# Patient Record
Sex: Female | Born: 1975 | Race: Black or African American | Hispanic: No | Marital: Married | Smoking: Never smoker
Health system: Southern US, Community
[De-identification: ages and names within clinical notes are randomized; demographics above are authoritative.]

## PROBLEM LIST (undated history)

## (undated) DIAGNOSIS — R011 Cardiac murmur, unspecified: Secondary | ICD-10-CM

## (undated) DIAGNOSIS — G43909 Migraine, unspecified, not intractable, without status migrainosus: Secondary | ICD-10-CM

## (undated) DIAGNOSIS — G510 Bell's palsy: Secondary | ICD-10-CM

## (undated) DIAGNOSIS — E559 Vitamin D deficiency, unspecified: Secondary | ICD-10-CM

## (undated) HISTORY — DX: Migraine, unspecified, not intractable, without status migrainosus: G43.909

## (undated) HISTORY — DX: Bell's palsy: G51.0

## (undated) HISTORY — PX: WISDOM TOOTH EXTRACTION: SHX21

## (undated) HISTORY — PX: TUBAL LIGATION: SHX77

## (undated) HISTORY — PX: OTHER SURGICAL HISTORY: SHX169

## (undated) HISTORY — DX: Vitamin D deficiency, unspecified: E55.9

---

## 2004-09-06 ENCOUNTER — Other Ambulatory Visit: Admission: RE | Admit: 2004-09-06 | Discharge: 2004-09-06 | Payer: Self-pay | Admitting: Obstetrics and Gynecology

## 2005-08-31 ENCOUNTER — Inpatient Hospital Stay (HOSPITAL_COMMUNITY): Admission: AD | Admit: 2005-08-31 | Discharge: 2005-08-31 | Payer: Self-pay | Admitting: Obstetrics and Gynecology

## 2005-09-11 ENCOUNTER — Other Ambulatory Visit: Admission: RE | Admit: 2005-09-11 | Discharge: 2005-09-11 | Payer: Self-pay | Admitting: Obstetrics and Gynecology

## 2006-01-21 ENCOUNTER — Encounter: Admission: RE | Admit: 2006-01-21 | Discharge: 2006-01-21 | Payer: Self-pay | Admitting: Obstetrics and Gynecology

## 2006-04-15 ENCOUNTER — Inpatient Hospital Stay (HOSPITAL_COMMUNITY): Admission: AD | Admit: 2006-04-15 | Discharge: 2006-04-20 | Payer: Self-pay | Admitting: Obstetrics and Gynecology

## 2006-04-17 ENCOUNTER — Encounter (INDEPENDENT_AMBULATORY_CARE_PROVIDER_SITE_OTHER): Payer: Self-pay | Admitting: Specialist

## 2007-09-12 ENCOUNTER — Emergency Department (HOSPITAL_COMMUNITY): Admission: EM | Admit: 2007-09-12 | Discharge: 2007-09-12 | Payer: Self-pay | Admitting: Emergency Medicine

## 2010-07-17 ENCOUNTER — Inpatient Hospital Stay (HOSPITAL_COMMUNITY): Admission: RE | Admit: 2010-07-17 | Discharge: 2010-07-20 | Payer: Self-pay | Admitting: Obstetrics and Gynecology

## 2010-12-06 LAB — CBC
HCT: 27.1 % — ABNORMAL LOW (ref 36.0–46.0)
HCT: 32 % — ABNORMAL LOW (ref 36.0–46.0)
Hemoglobin: 11.1 g/dL — ABNORMAL LOW (ref 12.0–15.0)
Hemoglobin: 9.3 g/dL — ABNORMAL LOW (ref 12.0–15.0)
MCH: 33 pg (ref 26.0–34.0)
MCH: 33.2 pg (ref 26.0–34.0)
MCHC: 34.4 g/dL (ref 30.0–36.0)
MCHC: 34.8 g/dL (ref 30.0–36.0)
MCV: 94.8 fL (ref 78.0–100.0)
MCV: 96.5 fL (ref 78.0–100.0)
Platelets: 151 10*3/uL (ref 150–400)
Platelets: 169 10*3/uL (ref 150–400)
RBC: 2.81 MIL/uL — ABNORMAL LOW (ref 3.87–5.11)
RBC: 3.38 MIL/uL — ABNORMAL LOW (ref 3.87–5.11)
RDW: 13.5 % (ref 11.5–15.5)
RDW: 13.5 % (ref 11.5–15.5)
WBC: 10.6 10*3/uL — ABNORMAL HIGH (ref 4.0–10.5)
WBC: 8.3 10*3/uL (ref 4.0–10.5)

## 2010-12-06 LAB — RPR: RPR Ser Ql: NONREACTIVE

## 2010-12-06 LAB — SURGICAL PCR SCREEN
MRSA, PCR: NEGATIVE
Staphylococcus aureus: NEGATIVE
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
## 2011-02-09 NOTE — Discharge Summary (Signed)
[name], PUCCIARELLI              ACCOUNT NO.:  [id number]   MEDICAL RECORD NO.:  [id number]          PATIENT TYPE:  INP   LOCATION:  9304                          [facility]ACILITY:  [facility]   PHYSICIAN:  Janine Limbo, M.D.DATE OF BIRTH:  05/29/1976   DATE OF ADMISSION:  04/15/2006  DATE OF DISCHARGE:  04/20/2006                                 DISCHARGE SUMMARY   ADMISSION DIAGNOSES:  1.  Intrauterine pregnancy at term.  2.  Gestational diabetes mellitus.  3.  Unfavorable cervix.   DISCHARGE DIAGNOSES:  1.  Intrauterine pregnancy at term.  2.  Gestational diabetes mellitus.  3.  Unfavorable cervix.  4.  Failed induction.  5.  Chorioamnionitis.  6.  Failure to progress.   PROCEDURE:  Primary low transverse cesarean section.   HOSPITAL COURSE:  Ms. Lopata is a 35 year old married black female, gravida  2, para 0-0-1-0, who was admitted at 39-3/7 weeks for induction of labor.  Her pregnancy had been followed by the [facility] OB/GYN M.D. Service,  and had been remarkable for history of microscopic hematuria with no  urologist evaluation, increased body mass index, first trimester bleeding,  history of migraines, history of heart murmur resolved, history of melanoma  on the right hand, gestational diabetes diet controlled.  Upon admission,  the patient received two doses of Cytotec per vagina after a reactive fetal  heart rate tracing was established.  This was on the evening of April 15, 2006, and the orders were per Dr. Estanislado Pandy.  By late morning on April 16, 2006,  the patient was started on Pitocin per low dose protocol.  By the afternoon,  her cervix was 1 cm, 25% effaced, and high, and bedside ultrasound confirmed  vertex presentation.  Fetal heart rate remained reassuring.  She was  contracting every two minutes.  She received Stadol and Phenergan for pain  medication.  The patient continued to receive Pitocin during the day on April 16, 2006.  By that evening, she had  progressed to 2 cm, 80% effaced, -2 to -  3 station, and her membranes were ruptured for clear fluid.  Blood sugars  remained stable.  Fetal heart rate remained stable.  By the morning of April 17, 2006, the patient was continuing to receive Pitocin.  Her cervix was 3  cm, 80 to 90%, and -2 station.  An IUPC had been placed during the evening  before when she was 2 cm.  By late morning on April 17, 2006, the patient's  cervix remained unchanged.  MDUs were within adequate range.  Light meconium  fluid was noted.  By the afternoon, she was 4 to 5 cm, 90% effaced.  She was  started on Unasyn due to a temperature elevation of 101.5 maximum.  She was  also given Tylenol.  By four in the afternoon, the patient had no cervical  change for two hours.  Cesarean section was recommended by Dr. Normand Sloop.  After discussing the decision with the family members, the patient elected  to proceed.  Risks and benefits were reviewed with the patient, and she  was  taken to the operating room.  Infant was a viable female with Apgar's of 2  and 8.  Weight was 6 pounds.  Core pH was 7.26.  There was also nuchal cord  x1, and pearly amniotic fluid was noted.  Infant was taken to the NICU for  care.  The patient tolerated the cesarean section well, and was taken to the  recovery room in good condition.  By postoperative day #1, she had some  elevated blood pressures at 137/92, 142/87, her other vital signs were  stable.  Her fasting blood sugar was 118, but that is after having a  popsicle.  She was positive in her fluid balance by 435 cm.  Her hemoglobin  was 9.6, and had been 11.2 preoperatively.  PIH labs were checked that  morning due to elevated blood pressures and low platelets of 146.  They had  been 193 preoperatively.  PIH labs were within normal limits.  Urine showed  no protein.  The patient was given IV Lasix x1 due to fluid retention from  the two day induction.  Her weight was 222 on admission, and 231  on  postoperative day #1.  By postoperative day #2, the patient was doing well,  her blood pressure was more stable at 130/80, her weight was 227, and  fasting blood sugar was 105.  Her hemoglobin was 9.1, and platelets were  161.  By postoperative day #3, she was doing well and was ready to go home.  Infant remained in the NICU and the patient was pumping breast milk.  She  preferred to decide contraception at her six week post-partum visit.  Blood  pressure was 118/69, her other vital signs were stable, she was afebrile.  Fasting blood sugar was 117.  Weight was 222 pounds.  JP drain was draining  a small amount of serous fluid and the drain was discontinued without  incident.  The patient was deemed to have received the full benefit of her  hospital stay and she was discharged home.   DISCHARGE MEDICATIONS:  1.  Motrin 600 mg one p.o. q.6h. p.r.n. pain.  2.  Tylox one to two p.o. q.3-4h. p.r.n. pain.  3.  Prenatal vitamin one p.o. daily.   DISCHARGE INSTRUCTIONS:  Per [facility] handout.   FOLLOWUP:  [facility] in six weeks or as needed.      Cam Hai, C.N.M.      Janine Limbo, M.D.  Electronically Signed    KS/MEDQ  D:  04/20/2006  T:  04/20/2006  Job:  [id number]

## 2011-02-09 NOTE — Op Note (Signed)
[name], [name]              ACCOUNT NO.:  [id number]   MEDICAL RECORD NO.:  [id number]          PATIENT TYPE:  INP   LOCATION:  9169                          [facility]ACILITY:  [facility]   PHYSICIAN:  Naima A. Dillard, M.D. DATE OF BIRTH:  09-15-1976   DATE OF PROCEDURE:  04/17/2006  DATE OF DISCHARGE:                                 OPERATIVE REPORT   PREOPERATIVE DIAGNOSES:  1. Intrauterine pregnancy at term.  2. Failure to progress.  3. Chorioamnionitis.  4. Gestational diabetes, diet controlled.  5. Failed induction.   POSTOPERATIVE DIAGNOSES:  1. Intrauterine pregnancy at term.  2. Failure to progress.  3. Chorioamnionitis.  4. Gestational diabetes, diet controlled.  5. Failed induction.   PROCEDURE:  Primary low transverse cesarean section.   SURGEON:  Naima A. Normand Sloop, M.D.   ASSISTANT:  Renaldo Reel. Emilee Hero, C.N.M.   ANESTHESIA:  Epidural.   INTRAVENOUS FLUIDS:  Crystalloid 1200 cc.   URINE OUTPUT:  100 cc.   ESTIMATED BLOOD LOSS:  700 cc.   COMPLICATIONS:  None.   FINDINGS:  A female infant in vertex presentation with a nuchal cord times  one.  There was purulent amniotic fluid.  Apgars were 2 and 8, with a cord  pH of 7.26.  Normal appearing abdominal and pelvic anatomy.   PROCEDURE IN DETAIL:  The patient was taken to the operating room where her  epidural anesthesia was found to be adequate.  She was placed in dorsal  supine position with a left lateral tilt, and once her anesthesia was found  to be adequate, a Pfannenstiel skin incision was made with a scalpel and  then carried down to the fascia using Bovie cautery.  The fascia was incised  in the midline and extended bilaterally using Mayo scissors.  Kochers times  two were placed and the superior aspect of the fascia was dissected off the  rectus muscles, both sharply and bluntly.  The inferior aspect of the rectus  muscles were separated from the fascia in a similar fashion.  Rectus muscles  were separated  in the midline, and peritoneum identified, tented up and  entered sharply, and extended inferiorly and superiorly with good  visualization of bowel and bladder.  The bladder blade was inserted.  Vesicouterine peritoneum was identified and entered sharply, and extended  bilaterally.  Bladder blade was inserted.  Primary lower transverse uterine  incision was made with the scalpel and then extended bilaterally bluntly.  There was noted to be purulent material.  Infant's head was delivered.  Mouth and nares were bulb suctioned.  Cord times one was easily reduced.  The body was delivered.  Cord was clamped.  The NICU team was not present  even though, according to the nurses, they were called.  At this time the  nurse anesthetist and the nurse attended the baby while I attended the  patient.  The NICU team did finally arrive and the baby was resuscitated  without difficulty.  Cord gases were obtained.  Cord blood was obtained.  The placenta was delivered manually.  The uterus was cleared of all clot and  debris.  Uterine incision was repaired with 0 Vicryl in running locked  fashion.  A second layer was used for imbrication of the uterus.  There was  some bleeding on the left side of the uterus, which was made hemostatic with  two figure-of-eight 0 Vicryl stitches.  Irrigation was done.  Hemostasis was  assured.  The patient had normal appearing tubes, ovaries and abdominal  anatomy.  Peritoneum was closed with 0 chromic, then muscle was inspected  and noted to be hemostatic.  The fascia was closed with 0 Vicryl in a  running fashion.  Jackson-Pratt drain was placed in the subcutaneous tissue,  and the subcutaneous tissue was reapproximated using 2-0 plain.  The skin  was reapproximated using 3-0 Monocryl subcuticular fashion.  Sponge, lap and  needle counts were correct.  The patient went to the recovery room in stable  condition.      Naima A. Normand Sloop, M.D.  Electronically Signed      NAD/MEDQ  D:  04/17/2006  T:  04/17/2006  Job:  [id number]

## 2013-04-21 ENCOUNTER — Emergency Department (HOSPITAL_COMMUNITY)
Admission: EM | Admit: 2013-04-21 | Discharge: 2013-04-21 | Disposition: A | Discharge status: Home / Self Care | Payer: BC Managed Care – PPO | Source: Home / Self Care | Attending: Family Medicine | Admitting: Family Medicine

## 2013-04-21 ENCOUNTER — Encounter (HOSPITAL_COMMUNITY): Payer: Self-pay | Admitting: Emergency Medicine

## 2013-04-21 DIAGNOSIS — N390 Urinary tract infection, site not specified: Secondary | ICD-10-CM

## 2013-04-21 DIAGNOSIS — K5289 Other specified noninfective gastroenteritis and colitis: Secondary | ICD-10-CM

## 2013-04-21 DIAGNOSIS — K529 Noninfective gastroenteritis and colitis, unspecified: Secondary | ICD-10-CM

## 2013-04-21 LAB — POCT URINALYSIS DIP (DEVICE)
Bilirubin Urine: NEGATIVE
Glucose, UA: NEGATIVE mg/dL
Ketones, ur: NEGATIVE mg/dL
Nitrite: NEGATIVE
Protein, ur: 100 mg/dL — AB
Specific Gravity, Urine: 1.02 (ref 1.005–1.030)
pH: 7 (ref 5.0–8.0)

## 2013-04-21 LAB — POCT PREGNANCY, URINE: Preg Test, Ur: NEGATIVE

## 2013-04-21 MED ORDER — CEPHALEXIN 500 MG PO CAPS: 500.0000 mg | ORAL_CAPSULE | Freq: Four times a day (QID) | ORAL | Status: DC

## 2013-04-21 NOTE — ED Notes (Signed)
Pt reports lower back pain, abdominal cramping, n/v/d. Symptoms started this a.m Pt has been increasing water intake and drinking cranberry juice.  Alert and oriented no signs of distress.

## 2013-04-21 NOTE — ED Provider Notes (Signed)
  CSN: [id number]     Arrival date & time 04/21/13  1935 History     First MD Initiated Contact with Patient 04/21/13 1945     Chief Complaint  Patient presents with  . Back Pain    symptoms started this a.m  . Abdominal Pain   (Consider location/radiation/quality/duration/timing/severity/associated sxs/prior Treatment) Patient is a 37 y.o. female presenting with back pain and abdominal pain. The history is provided by the patient.  Back Pain Location:  Gluteal region Quality:  Aching Radiates to:  Does not radiate Pain severity:  Mild Chronicity:  New Associated symptoms: abdominal pain, dysuria and pelvic pain   Abdominal Pain Associated symptoms include abdominal pain.    History reviewed. No pertinent past medical history. History reviewed. No pertinent past surgical history. History reviewed. No pertinent family history. History  Substance Use Topics  . Smoking status: Never Smoker   . Smokeless tobacco: Not on file  . Alcohol Use: Yes   OB History   Grav Para Term Preterm Abortions TAB SAB Ect Mult Living                 Review of Systems  Constitutional: Negative.   HENT: Negative.   Gastrointestinal: Positive for nausea, vomiting, abdominal pain and diarrhea.  Genitourinary: Positive for dysuria, urgency, frequency and pelvic pain.  Musculoskeletal: Positive for back pain.  Skin: Negative.     Allergies  Review of patient's allergies indicates no known allergies.  Home Medications  No current outpatient prescriptions on file. BP 143/90  Pulse 64  Temp(Src) 98.3 F (36.8 C) (Oral)  Resp 18  SpO2 98%  LMP 04/07/2013 Physical Exam  Nursing note and vitals reviewed. Constitutional: She is oriented to person, place, and time. She appears well-developed and well-nourished. No distress.  Abdominal: Soft. Bowel sounds are normal. She exhibits no distension and no mass. There is no hepatosplenomegaly. There is tenderness in the suprapubic area. There is no  rebound, no guarding and no CVA tenderness.  Neurological: She is alert and oriented to person, place, and time.  Skin: Skin is warm and dry.    ED Course   Procedures (including critical care time)  Labs Reviewed  POCT URINALYSIS DIP (DEVICE) - Abnormal; Notable for the following:    Hgb urine dipstick LARGE (*)    Protein, ur 100 (*)    Leukocytes, UA SMALL (*)    All other components within normal limits  POCT PREGNANCY, URINE   No results found. 1. UTI (lower urinary tract infection)   2. Gastroenteritis, acute     MDM    Linna Hoff, MD 04/21/13 2014

## 2018-05-20 ENCOUNTER — Other Ambulatory Visit: Payer: Self-pay

## 2018-05-20 ENCOUNTER — Encounter (HOSPITAL_COMMUNITY): Payer: Self-pay | Admitting: *Deleted

## 2018-05-20 ENCOUNTER — Emergency Department (HOSPITAL_COMMUNITY)
Admission: EM | Admit: 2018-05-20 | Discharge: 2018-05-20 | Disposition: A | Discharge status: Home / Self Care | Payer: BC Managed Care – PPO | Attending: Emergency Medicine | Admitting: Emergency Medicine

## 2018-05-20 DIAGNOSIS — G510 Bell's palsy: Secondary | ICD-10-CM | POA: Diagnosis not present

## 2018-05-20 DIAGNOSIS — R29810 Facial weakness: Secondary | ICD-10-CM | POA: Diagnosis present

## 2018-05-20 MED ORDER — PREDNISONE 20 MG PO TABS: 60.0000 mg | ORAL_TABLET | Freq: Every day | ORAL | 0 refills | Status: DC

## 2018-05-20 MED ORDER — PREDNISONE 20 MG PO TABS
60.0000 mg | ORAL_TABLET | Freq: Once | ORAL | Status: AC
  Administered 2018-05-20: 60 mg via ORAL
  Filled 2018-05-20: qty 3

## 2018-05-20 NOTE — ED Notes (Signed)
Pt stable, ambulatory, and verbalizes understanding of d/c instructions.  

## 2018-05-20 NOTE — ED Triage Notes (Signed)
States her tongue started getting sore on Fri. States the left side of her mouth was getting numb. States last night her right eye wouldn't close as well as the left. C/o drawing to the right side of the mouth. No weakness in ext.

## 2018-05-20 NOTE — ED Provider Notes (Signed)
[facility] EMERGENCY DEPARTMENT Provider Note   CSN: [id number] Arrival date & time: 05/20/18  3785     History   Chief Complaint Chief Complaint  Patient presents with  . Facial Droop    HPI [name] is a 42 y.o. female.  Patient with no significant past medical history presents with acute onset and slowly worsening left-sided facial droop and inability to close her left eye starting yesterday.  Patient noted some numbness and tingling in her left face and left tongue yesterday with a change in taste.  This morning when she woke up and was brushing her teeth she noted some toothpaste dribbling from the left corner of her mouth.  She denies any vision changes, headache.  No head injury.  No fevers or confusion.  No weakness, numbness, or tingling in the arms of the legs.  No difficulty with coordination or with walking.  Patient's father, who reportedly had a poor lifestyle and used drugs and alcohol, had "an aneurysm" at about age 59 and passed away.  Patient states that she has had 2 family members on that side who have had Bell's palsy.  No treatments prior to arrival.     History reviewed. No pertinent past medical history.  There are no active problems to display for this patient.   Past Surgical History:  Procedure Laterality Date  . CESAREAN SECTION       OB History   None      Home Medications    Prior to Admission medications   Medication Sig Start Date End Date Taking? Authorizing Provider  cephALEXin (KEFLEX) 500 MG capsule Take 1 capsule (500 mg total) by mouth 4 (four) times daily. Take all of medicine and drink lots of fluids 04/21/13   Billy Fischer, MD    Family History No family history on file.  Social History Social History   Tobacco Use  . Smoking status: Never Smoker  . Smokeless tobacco: Never Used  Substance Use Topics  . Alcohol use: Yes  . Drug use: No     Allergies   Patient has no known  allergies.   Review of Systems Review of Systems  Constitutional: Negative for fever.  HENT: Negative for congestion, dental problem, rhinorrhea and sinus pressure.        + dysgeusia  Eyes: Negative for photophobia, discharge, redness and visual disturbance.  Respiratory: Negative for shortness of breath.   Cardiovascular: Negative for chest pain.  Gastrointestinal: Negative for nausea and vomiting.  Musculoskeletal: Negative for gait problem, neck pain and neck stiffness.  Skin: Negative for rash.  Neurological: Positive for facial asymmetry and weakness. Negative for syncope, speech difficulty, light-headedness, numbness and headaches.  Psychiatric/Behavioral: Negative for confusion.     Physical Exam Updated Vital Signs BP (!) 144/105 (BP Location: Right Arm)   Pulse 66   Temp 97.7 F (36.5 C) (Oral)   Resp 18   Ht 5' 2.25" (1.581 m)   Wt 81.6 kg   SpO2 100%   BMI 32.66 kg/m   Physical Exam  Constitutional: She is oriented to person, place, and time. She appears well-developed and well-nourished.  HENT:  Head: Normocephalic and atraumatic.  Right Ear: Tympanic membrane, external ear and ear canal normal.  Left Ear: Tympanic membrane, external ear and ear canal normal.  Nose: Nose normal.  Mouth/Throat: Uvula is midline, oropharynx is clear and moist and mucous membranes are normal.  Eyes: Pupils are equal, round, and reactive to light.  Conjunctivae, EOM and lids are normal. Right eye exhibits no nystagmus. Left eye exhibits no nystagmus.  Neck: Normal range of motion. Neck supple.  No carotid bruits heard.   Cardiovascular: Normal rate and regular rhythm.  Pulmonary/Chest: Effort normal and breath sounds normal.  Abdominal: Soft. There is no tenderness.  Musculoskeletal:       Cervical back: She exhibits normal range of motion, no tenderness and no bony tenderness.  Neurological: She is alert and oriented to person, place, and time. She has normal strength and  normal reflexes. A cranial nerve deficit is present. No sensory deficit. She displays a negative Romberg sign. Coordination and gait normal. GCS eye subscore is 4. GCS verbal subscore is 5. GCS motor subscore is 6.  CN VII nerve palsy: L facial droop noted with smiling, L ptosis, weakness of L forehead, normal sensation on exam.   Pupils PERRL. No tongue deviation.   Skin: Skin is warm and dry.  Psychiatric: She has a normal mood and affect.  Nursing note and vitals reviewed.    ED Treatments / Results  Labs (all labs ordered are listed, but only abnormal results are displayed) Labs Reviewed - No data to display  EKG None  Radiology No results found.  Procedures Procedures (including critical care time)  Medications Ordered in ED Medications - No data to display   Initial Impression / Assessment and Plan / ED Course  I have reviewed the triage vital signs and the nursing notes.  Pertinent labs & imaging results that were available during my care of the patient were reviewed by me and considered in my medical decision making (see chart for details).     Patient seen and examined. Symptoms suggestive of Bell's palsy.  No vesicles noted in ear canal.  No headache or head trauma.  Low concern for intracranial bleeding.  Vital signs reviewed and are as follows: BP (!) 122/109   Pulse 63   Temp 97.7 F (36.5 C) (Oral)   Resp 18   Ht 5' 2.25" (1.581 m)   Wt 81.6 kg   SpO2 100%   BMI 32.66 kg/m   8:25 AM Patient discussed with and seen by Dr. Sherry Ruffing.   Agrees Bell's Palsy. Patient has moderate symptoms. No indication for antivirals per Up-to-date.   Pt updated.   Patient urged to return with worsening symptoms or other concerns. Patient verbalized understanding and agrees with plan.    Final Clinical Impressions(s) / ED Diagnoses   Final diagnoses:  Bell's palsy   CN VII nerve palsy.  Forehead involvement noted.  No other focal neurological deficits on exam.   Do not feel that imaging is indicated at this time.  Treatment as above.  Patient given first dose of prednisone in the emergency department.  Home with 6 additional days.  No evidence for antivirals.  ED Discharge Orders         Ordered    predniSONE (DELTASONE) 20 MG tablet  Daily     05/20/18 0822           Carlisle Cater, PA-C 05/20/18 0827    Tegeler, Gwenyth Allegra, MD 05/20/18 [phone]

## 2018-05-20 NOTE — ED Notes (Signed)
ED Provider at bedside. 

## 2018-05-20 NOTE — Discharge Instructions (Signed)
Please read and follow all provided instructions.  Your diagnoses today include:  1. Bell's palsy     Tests performed today include:  Vital signs. See below for your results today.   Medications prescribed:   Prednisone - steroid medicine   It is best to take this medication in the morning to prevent sleeping problems. If you are diabetic, monitor your blood sugar closely and stop taking Prednisone if blood sugar is over 300. Take with food to prevent stomach upset.   Take any prescribed medications only as directed.  Home care instructions:  Follow any educational materials contained in this packet.  Use artificial tears in left eye so it doesn't dry out once an hour while awake. Use a patch or carefully tape eye to keep it closed at night.   Follow-up instructions: Please follow-up with your primary care provider in the next 3 days for further evaluation of your symptoms.   Return instructions:   Please return to the Emergency Department if you experience worsening symptoms.   Please return if you have any other emergent concerns.  Additional Information:  Your vital signs today were: BP 129/82    Pulse (!) 59    Temp 97.7 F (36.5 C) (Oral)    Resp 18    Ht 5' 2.25" (1.581 m)    Wt 81.6 kg    SpO2 99%    BMI 32.66 kg/m  If your blood pressure (BP) was elevated above 135/85 this visit, please have this repeated by your doctor within one month. --------------

## 2018-08-05 ENCOUNTER — Encounter: Payer: Self-pay | Admitting: *Deleted

## 2018-08-06 ENCOUNTER — Encounter: Payer: Self-pay | Admitting: Neurology

## 2018-08-06 ENCOUNTER — Ambulatory Visit: Payer: BC Managed Care – PPO | Admitting: Neurology

## 2018-08-06 VITALS — BP 120/86 | HR 70 | Ht 62.25 in | Wt 199.5 lb

## 2018-08-06 DIAGNOSIS — R432 Parageusia: Secondary | ICD-10-CM | POA: Diagnosis not present

## 2018-08-06 DIAGNOSIS — G510 Bell's palsy: Secondary | ICD-10-CM

## 2018-08-06 DIAGNOSIS — R471 Dysarthria and anarthria: Secondary | ICD-10-CM

## 2018-08-06 NOTE — Progress Notes (Signed)
[facility] Clinic Note - Initial Visit   Date: 08/06/18  [name] MRN: [id number] DOB: Apr 15, 1976   Dear Dr. Josefine Class:   Thank you for your kind referral of [name] for consultation of left Bell's palsy. Although her history is well known to you, please allow [name] to reiterate it for the purpose of our medical record. The patient was accompanied to the clinic by self.   History of Present Illness: [name] is a 42 y.o. left-handed African American female with no prior medical history presenting for evaluation of left Bell's palsy.  She is a Technical sales engineer.   She was in her usual state of health until 05/19/2017.  She had a staff meeting on 05/19/2018 and remembers that the taste of water and gatorade did not taste well.  That evening, she could not taste her dinner. By nighttime, she noticed weakness of the left eye and being unable to blink.  The following morning, she developed severe photosensitivity and left facial droop and slurred speech.  She did not have weakness of the left arm/leg.  She was evaluated in the ER on 8/27, diagnosed with left Bell's palsy and given course of prednisone.  She had noticed mild improvement, such that her eye will now close at night time, but she continues to have severe facial droop and eyelid weakness.   She was previously having severe headaches, which has improved. Rest can help improve her speech.  She has not returned to work because of speech difficulty.  Her PCP check HbA1c and TSH which is normal   Past Medical History:  Diagnosis Date  . Bell's palsy   . Migraine   . Vitamin D deficiency     Past Surgical History:  Procedure Laterality Date  . CESAREAN SECTION    . TUBAL LIGATION       Medications:  Outpatient Encounter Medications as of 08/06/2018  Medication Sig  . ergocalciferol (VITAMIN D2) 1.25 MG (50000 UT) capsule Take 50,000 Units by mouth once a week.  . phentermine 37.5  MG capsule Take 37.5 mg by mouth every morning.   No facility-administered encounter medications on file as of 08/06/2018.      Allergies: No Known Allergies  Family History: Family History  Problem Relation Age of Onset  . Hypertension Father   . Aneurysm Father   . Stroke Father   . Healthy Mother   . Healthy Sister     Social History: Social History   Tobacco Use  . Smoking status: Never Smoker  . Smokeless tobacco: Never Used  Substance Use Topics  . Alcohol use: Yes    Comment: socially  . Drug use: No   Social History   Social History Narrative   Lives with husband, 3 children and granddaughter in a 2 story home.     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     Education: Masters.     Review of Systems:  CONSTITUTIONAL: No fevers, chills, night sweats, or weight loss.   EYES: No visual changes or eye pain ENT: No hearing changes.  No history of nose bleeds.   RESPIRATORY: No cough, wheezing and shortness of breath.   CARDIOVASCULAR: Negative for chest pain, and palpitations.   GI: Negative for abdominal discomfort, blood in stools or black stools.  No recent change in bowel habits.   GU:  No history of incontinence.   MUSCLOSKELETAL: No history of joint pain  or swelling.  No myalgias.   SKIN: Negative for lesions, rash, and itching.   HEMATOLOGY/ONCOLOGY: Negative for prolonged bleeding, bruising easily, and swollen nodes.  No history of cancer.   ENDOCRINE: Negative for cold or heat intolerance, polydipsia or goiter.   PSYCH:  No depression or anxiety symptoms.   NEURO: As Above.   Vital Signs:  BP 120/86   Pulse 70   Ht 5' 2.25" (1.581 m)   Wt 199 lb 8 oz (90.5 kg)   SpO2 100%   BMI 36.20 kg/m    General Medical Exam:   General:  Well appearing, comfortable.   Eyes/ENT: see cranial nerve examination.   Neck: No masses appreciated.  Full range of motion without tenderness.  No carotid bruits. Respiratory:  Clear to  auscultation, good air entry bilaterally.   Cardiac:  Regular rate and rhythm, no murmur.   Extremities:  No deformities, edema, or skin discoloration.  Skin:  No rashes or lesions.  Neurological Exam: MENTAL STATUS including orientation to time, place, person, recent and remote memory, attention span and concentration, language, and fund of knowledge is normal.  Speech is not dysarthric.  CRANIAL NERVES: II:  No visual field defects.  Unremarkable fundi.   III-IV-VI: Pupils equal round and reactive to light.  Normal conjugate, extra-ocular eye movements in all directions of gaze.  No nystagmus.  No ptosis.   V:  Normal facial sensation. Jaw jerk is absent.   VII:  Severe left facial weakness with asymmetric smile.  She had no movement of frontalis, 1/4 orbicularis oris and oculi, 0/5 buccinator and platysma.  No snout.  Myerson's reflex is present on the right only.   IX-X:  Normal palatal movement.   XI:  Normal shoulder shrug and head rotation.   XII:  Normal tongue strength and range of motion, no deviation or fasciculation.  MOTOR:  No atrophy, fasciculations or abnormal movements.  No pronator drift.  Tone is normal.    Right Upper Extremity:    Left Upper Extremity:    Deltoid  5/5   Deltoid  5/5   Biceps  5/5   Biceps  5/5   Triceps  5/5   Triceps  5/5   Wrist extensors  5/5   Wrist extensors  5/5   Wrist flexors  5/5   Wrist flexors  5/5   Finger extensors  5/5   Finger extensors  5/5   Finger flexors  5/5   Finger flexors  5/5   Dorsal interossei  5/5   Dorsal interossei  5/5   Abductor pollicis  5/5   Abductor pollicis  5/5   Tone (Ashworth scale)  0  Tone (Ashworth scale)  0   Right Lower Extremity:    Left Lower Extremity:    Hip flexors  5/5   Hip flexors  5/5   Hip extensors  5/5   Hip extensors  5/5   Knee flexors  5/5   Knee flexors  5/5   Knee extensors  5/5   Knee extensors  5/5   Dorsiflexors  5/5   Dorsiflexors  5/5   Plantarflexors  5/5   Plantarflexors  5/5    Toe extensors  5/5   Toe extensors  5/5   Toe flexors  5/5   Toe flexors  5/5   Tone (Ashworth scale)  0  Tone (Ashworth scale)  0   MSRs:  Right  Left brachioradialis 2+  brachioradialis 2+  biceps 2+  biceps 2+  triceps 2+  triceps 2+  patellar 2+  patellar 2+  ankle jerk 2+  ankle jerk 2+  Hoffman no  Hoffman no  plantar response down  plantar response down   SENSORY:  Normal and symmetric perception of light touch, pinprick, vibration, and proprioception.   COORDINATION/GAIT: Normal finger-to- nose-finger.  Intact rapid alternating movements bilaterally. Gait narrow based and stable. Tandem and stressed gait intact.    IMPRESSION: Mrs. Valade is a 42 year-old female referred for evaluation of left facial paresis which started in August 2019.  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  She has mild dysarthria with labial sounds, which is most likely due to her facial weakness.  There is no sensory changes.  She does have Myerson's sign.  Unfortunately, she continues to have severe facial weakness that hopefully will improve with time.  I encouraged her to do facial exercises and offered speech therapy, which she declined.  Although classic Bell's palsy can certainly manifest with facial paresis and changes in taste, dysarthria and photosensitivity is atypical.  Therefore, I will obtain MRI/A brain to evaluate for any structural pathology, as well as aneurysm, given that her father died of ruptured cerebral aneurysm.   I had extensive discussion regarding pathophysiology, natural course, and management options.    PLAN/RECOMMENDATIONS:  1. MRI brain wwo contrast 2. MRA head 3. Apply eye protection such as eye patch to eye when sleeping to prevent corneal abrasion  4. Apply ointment to eye prior to sleeping  5. Apply normal saline eye drops as needed throughout the  day.  Return to clinic in 3 months   Thank you for allowing me to participate in patient's care.  If I can answer any additional questions, I would be pleased to do so.    Sincerely,    Iyad Deroo K. Posey Pronto, DO

## 2018-08-06 NOTE — Patient Instructions (Addendum)
MRI brain wwo contrast  MRA head  Apply eye protection such as eye patch to eye when sleeping to prevent corneal abrasion   Apply ointment to eye prior to sleeping   Apply normal saline eye drops as needed throughout the day  Please call my office if you would like to start speech therapy.    Return to clinic in 3 months

## 2018-08-29 ENCOUNTER — Other Ambulatory Visit: Payer: BC Managed Care – PPO

## 2018-09-22 ENCOUNTER — Other Ambulatory Visit: Payer: BC Managed Care – PPO

## 2018-09-22 ENCOUNTER — Inpatient Hospital Stay: Admission: RE | Admit: 2018-09-22 | Payer: BC Managed Care – PPO | Source: Ambulatory Visit

## 2018-09-23 ENCOUNTER — Ambulatory Visit
Admission: RE | Admit: 2018-09-23 | Discharge: 2018-09-23 | Disposition: A | Discharge status: Home / Self Care | Payer: BC Managed Care – PPO | Source: Ambulatory Visit | Attending: Neurology | Admitting: Neurology

## 2018-09-23 DIAGNOSIS — R471 Dysarthria and anarthria: Secondary | ICD-10-CM

## 2018-09-23 DIAGNOSIS — R432 Parageusia: Secondary | ICD-10-CM

## 2018-09-23 DIAGNOSIS — G510 Bell's palsy: Secondary | ICD-10-CM

## 2018-09-23 MED ORDER — GADOBENATE DIMEGLUMINE 529 MG/ML IV SOLN
18.0000 mL | Freq: Once | INTRAVENOUS | Status: AC | PRN
  Administered 2018-09-23: 18 mL via INTRAVENOUS

## 2018-09-29 ENCOUNTER — Telehealth: Payer: Self-pay | Admitting: Neurology

## 2018-09-29 NOTE — Telephone Encounter (Signed)
Called patient back and mail box is full.

## 2018-09-29 NOTE — Telephone Encounter (Signed)
Patient called about needing a letter for the ext on her short term disability. Please write this and fax to [phone]; ATTN: Joaquin [employer]. Also, she wants a call to know that Dr.Patel agreed to do this or not. Thanks!

## 2018-09-29 NOTE — Telephone Encounter (Signed)
Patient called back regarding information on her Short term Disability. Please Call. Thanks

## 2018-09-30 ENCOUNTER — Telehealth: Payer: Self-pay | Admitting: *Deleted

## 2018-09-30 NOTE — Telephone Encounter (Signed)
-----   Message from Alda Berthold, DO sent at 09/30/2018  2:01 PM EST ----- Please inform patient that her MRI brain shows trace nerve inflammation, which can be seen in Bell's palsy.  No other worrisome findings, such as any tumor, stroke, or multiple sclerosis.  Also, there her blood vessels are normal - no aneurysm.  We will follow-up with HR paperwork once it is forwarded to [place].  Thanks.

## 2018-09-30 NOTE — Telephone Encounter (Signed)
I will send letter with results.

## 2018-09-30 NOTE — Telephone Encounter (Signed)
I spoke with patient and informed her that we can't send a letter and requested for her to send me the proper paper work to fill out for [employer].

## 2018-10-02 ENCOUNTER — Other Ambulatory Visit: Payer: BC Managed Care – PPO

## 2018-10-09 ENCOUNTER — Telehealth: Payer: Self-pay | Admitting: Neurology

## 2018-10-09 NOTE — Telephone Encounter (Signed)
Patient calling about page 2 in paperwork that it should say "YES" and not no where it says it does mess with her ability to work. Please correct this and fax again. Long Term Dis paperwork been faxed also? Thanks!

## 2018-10-09 NOTE — Telephone Encounter (Signed)
Paper work has been faxed

## 2018-10-09 NOTE — Telephone Encounter (Signed)
Patient calling in wanting to know if this FMLA paperwork was ever faxed to her job. Please call at [phone]. Thanks!

## 2018-10-10 ENCOUNTER — Other Ambulatory Visit: Payer: BC Managed Care – PPO

## 2018-10-10 NOTE — Telephone Encounter (Signed)
Patient has been informed that we do not do long term disability paperwork.

## 2018-10-21 NOTE — Telephone Encounter (Signed)
Patient is calling in about her diability paperwork. Please call her back at [phone]. Thanks!

## 2018-10-22 NOTE — Telephone Encounter (Signed)
Patient has been informed that we will assess her at her appointment next week and discuss paperwork then.

## 2018-10-25 NOTE — Progress Notes (Signed)
Follow-up Visit   Date: 10/29/18    [name] MRN: [id number] DOB: 28-Aug-1976   Interim History: [name] is a 43 y.o. left-handed African American female with no prior medical history returning to the clinic for follow-up of left Bell's palsy.  She is a [employer].  The patient was accompanied to the clinic by husband who also provides collateral information.    History of present illness: She was in her usual state of health until 05/19/2017.  She had a staff meeting on 05/19/2018 and remembers that the taste of water and gatorade did not taste well.  That evening, she could not taste her dinner. By nighttime, she noticed weakness of the left eye and being unable to blink.  The following morning, she developed severe photosensitivity and left facial droop and slurred speech.  She did not have weakness of the left arm/leg.  She was evaluated in the ER on 8/27, diagnosed with left Bell's palsy and given course of prednisone.  She had noticed mild improvement, such that her eye will now close at night time, but she continues to have severe facial droop and eyelid weakness.   She was previously having severe headaches, which has improved. Rest can help improve her speech.  She has not returned to work because of speech difficulty.  Her PCP check HbA1c and TSH which is normal   UPDATE 10/29/2018: She is here for 68-month follow-up visit and has noticed trace movement of the left side of the face.  She is concerned about synkinesis and has noticed a dimple in the chin.  She denies any abnormal appearing, although has noticed that she is able to hear on the left side, which is improved.  Her photosensitivity has improved.  She continues to have lack of taste.  She is not currently working and is seeking continuous leave because her work involves speaking.  She reports being unable to speak as fast as she normally would, however is still able to communicate.  Her mood is doing  well.  Medications:  Current Outpatient Medications on File Prior to Visit  Medication Sig Dispense Refill  . phentermine 37.5 MG capsule Take 37.5 mg by mouth every morning.     No current facility-administered medications on file prior to visit.     Allergies: No Known Allergies  Review of Systems:  CONSTITUTIONAL: No fevers, chills, night sweats, or weight loss.  EYES: No visual changes or eye pain ENT: No hearing changes.  No history of nose bleeds.   RESPIRATORY: No cough, wheezing and shortness of breath.   CARDIOVASCULAR: Negative for chest pain, and palpitations.   GI: Negative for abdominal discomfort, blood in stools or black stools.  No recent change in bowel habits.   GU:  No history of incontinence.   MUSCLOSKELETAL: No history of joint pain or swelling.  No myalgias.   SKIN: Negative for lesions, rash, and itching.   ENDOCRINE: Negative for cold or heat intolerance, polydipsia or goiter.   PSYCH:  No  depression or anxiety symptoms.   NEURO: As Above.   Vital Signs:  BP 122/78   Pulse 68   Ht 5' 2.25" (1.581 m)   Wt 190 lb (86.2 kg)   SpO2 99%   BMI 34.47 kg/m   General Medical Exam:   General:  Well appearing, comfortable  Eyes/ENT: see cranial nerve examination.   Neck: No masses appreciated.  Full range of motion without tenderness.  No carotid  bruits. Respiratory:  Clear to auscultation, good air entry bilaterally.   Cardiac:  Regular rate and rhythm, no murmur.   Ext:  No edema  Neurological Exam: MENTAL STATUS including orientation to time, place, person, recent and remote memory, attention span and concentration, language, and fund of knowledge is normal.  Speech is not mildly dysarthric, 100% comprehensible speech.  CRANIAL NERVES:  Pupils equal round and reactive to light.  Normal conjugate, extra-ocular eye movements in all directions of gaze.  Mild left ptosis. Normal facial sensation. She continues to have moderate-severe left facial weakness,  there is 1/5 motor strength of frontalis, 3/5 orbicularis oculi, 2/5 buccinator, 3/5 nasalis, 2/5 orbicularis oris, and 2/5 platysma (improved).  Tongue is midline.  MOTOR:  Motor strength is 5/5 in all extremities.  No atrophy, fasciculations or abnormal movements.  No pronator drift.  Tone is normal.    MSRs:  Reflexes are 2+/4 throughout.  SENSORY:  Intact to vibration throughout.  COORDINATION/GAIT:   Gait narrow based and stable.   Data: MRI/A brain 09/23/2018: 1. Subtle asymmetric T2 signal and enhancement of the Left Facial Nerve mastoid segment. Questionable asymmetric enhancement also of the left geniculate ganglion. But otherwise normal appearing left internal auditory structures, normal left parotid space, and unremarkable face soft tissues. This suggest inflammation of the nerve such as due to Viral Neuritis. 2. Normal MRI appearance of the brain. No evidence of metastatic disease. 3. Normal intracranial MRA.  IMPRESSION/PLAN: Left Bell's palsy with moderate-severe facial weakness (House-brackmann scale IV) on the left (August 2019).  Exam shows slight improvement from her last visit such that she does have trace movement along the facial nerve myotome.  She is able to close her left eyelid better and there is no sign of corneal irritation.  She has very mild dysarthria with labial sounds.  I have encouraged to continue doing facial exercises at home.  Speech therapy was declined.  She may have some synkinesis involving the chin, which indicates that reinnervation is occurring.    She is requesting permanent disability.  She does not have visual impairment as result of Bell's palsy.  She reports being unable to speak, and therefore and not able to perform her work.  I am in disagreement of this, as she is able to verbally communicate.  She has mild dysarthria, and in my medical opinion and based on her evaluation, she does not need continuous medical leave.  Patient was not pleased  with this, and I offered that she can seek a second opinion or talk to her primary care doctor for their opinion.    Return to clinic in 6 months  Greater than 50% of this 30 minute visit was spent in counseling, explanation of diagnosis, and answering questions/conerns.    Thank you for allowing me to participate in patient's care.  If I can answer any additional questions, I would be pleased to do so.    Sincerely,    Donika K. Posey Pronto, DO

## 2018-10-28 ENCOUNTER — Telehealth: Payer: Self-pay | Admitting: Neurology

## 2018-10-28 NOTE — Telephone Encounter (Signed)
Patient walked into the office and was told this would be discussed at her appt tomorrow.

## 2018-10-28 NOTE — Telephone Encounter (Signed)
Patient wants to talk to someone about her FMLA paperwork and long term disability paperwork. She would like a call back today she states no one has called her back I saw where Caryl Pina called on the 10-22-18 and the patient states that Caryl Pina called her home phone and it was the 10-24-18. Please call the Cell phone  [phone]

## 2018-10-28 NOTE — Telephone Encounter (Signed)
Please call. Thanks.

## 2018-10-29 ENCOUNTER — Telehealth: Payer: Self-pay | Admitting: Neurology

## 2018-10-29 ENCOUNTER — Encounter: Payer: Self-pay | Admitting: Neurology

## 2018-10-29 ENCOUNTER — Ambulatory Visit: Payer: BC Managed Care – PPO | Admitting: Neurology

## 2018-10-29 VITALS — BP 122/78 | HR 68 | Ht 62.25 in | Wt 190.0 lb

## 2018-10-29 DIAGNOSIS — G510 Bell's palsy: Secondary | ICD-10-CM | POA: Diagnosis not present

## 2018-10-29 NOTE — Telephone Encounter (Signed)
Patient stopped by office and wanted you and Dr.Patel to write out in a document about what light duty consist of for her job and an exact date of when she can start. Her HR is needing this info. Please complete this and call her when it's ready. Thanks!

## 2018-10-29 NOTE — Patient Instructions (Signed)
Return to clinic in August 2019

## 2018-10-30 ENCOUNTER — Encounter: Payer: Self-pay | Admitting: Neurology

## 2018-10-30 NOTE — Telephone Encounter (Signed)
Please let me know what you would like for me to write IF you agree to do a letter.  Thanks.

## 2018-10-30 NOTE — Telephone Encounter (Signed)
Referral sent 

## 2018-10-30 NOTE — Telephone Encounter (Signed)
She needs functional capacity evaluation through occupational therapy to determine level of work she can perform.

## 2018-11-19 ENCOUNTER — Telehealth: Payer: Self-pay | Admitting: Neurology

## 2018-11-19 NOTE — Telephone Encounter (Signed)
Al called regarding this patient and needing to speak with you regarding a disability form. The Reference # is [id number]. Thanks

## 2018-11-25 NOTE — Telephone Encounter (Signed)
I will send paperwork back in informing them that we do not do LTD.

## 2018-12-22 ENCOUNTER — Telehealth: Payer: Self-pay | Admitting: Neurology

## 2018-12-22 NOTE — Telephone Encounter (Signed)
Patient stated she hasn't had any contact from OT that was supposed to be scheduled. Please call her. I relayed the msg about LTD forms and about her getting medical records. Thanks!

## 2018-12-22 NOTE — Telephone Encounter (Signed)
Referral was sent to [facility].  I will resend it.

## 2019-04-29 ENCOUNTER — Ambulatory Visit: Payer: BC Managed Care – PPO | Admitting: Neurology

## 2019-06-17 ENCOUNTER — Other Ambulatory Visit: Payer: Self-pay

## 2019-06-17 DIAGNOSIS — Z20822 Contact with and (suspected) exposure to covid-19: Secondary | ICD-10-CM

## 2019-06-18 LAB — NOVEL CORONAVIRUS, NAA: SARS-CoV-2, NAA: NOT DETECTED

## 2019-08-18 ENCOUNTER — Other Ambulatory Visit: Payer: Self-pay

## 2019-08-18 ENCOUNTER — Encounter: Payer: Self-pay | Admitting: Neurology

## 2019-08-18 ENCOUNTER — Ambulatory Visit: Payer: BC Managed Care – PPO | Admitting: Neurology

## 2019-08-18 ENCOUNTER — Telehealth: Payer: Self-pay | Admitting: Neurology

## 2019-08-18 VITALS — BP 163/99 | HR 60 | Ht 62.0 in | Wt 203.0 lb

## 2019-08-18 DIAGNOSIS — G510 Bell's palsy: Secondary | ICD-10-CM | POA: Diagnosis not present

## 2019-08-18 NOTE — Telephone Encounter (Signed)
Noted, thank you. I have made recommendations to her primary NP. Nothing further needed.

## 2019-08-18 NOTE — Progress Notes (Signed)
Subjective:    Patient ID: [name] is a 43 y.o. female.  HPI     Star Age, MD, PhD [facility] [address] [place], [place] [address]  Dear Dorothea Ogle,   I saw your patient, [name], upon your kind request to my neurologic clinic today for initial consultation of her history of Bell's palsy.  The patient is unaccompanied today.  As you know, [name] is a 43 year old right-handed woman with an underlying medical history of of migraine headaches, vitamin D deficiency, and Bell's palsy diagnosed in 2019, who reports ongoing issues with her Bell's palsy.  She has had some improvement but reports that she still has weakness on the left side, she recently saw her eye doctor, an optometrist locally, and is supposed to use lubricating eyedrops and an ointment at night.  She has not had any new symptoms.  She is a Pharmacist, hospital and teaches elementary school.  She is worried about having to go back to work.  She is worried about her exposure risk to Covid.  She is advised that having Bell's palsy is typically not considered high risk for a sign of an underlying autoimmune disease.  She states that she requested a letter to be able to go back to work and has not received it from Dr. Serita Grit office or your office.  She is advised to seek the letter from the office that took her out of work.  She states that she requested a second opinion to see if she could go back to work.  I reviewed your office note from 07/02/2019, which you kindly included.  She has been seeing Dr. Posey Pronto at [facility] neurology for her Bell's palsy which was diagnosed in November 2019.  Symptoms originally started in August 2019.  I reviewed Dr. Serita Grit office note from 08/06/2018.  She had been treated with steroids through the ER, she presented to the emergency room on 05/21/2019. Dr. Posey Pronto ordered imaging tests including a brain MRI with and without contrast and MRA head without  contrast, she had these test on 09/23/2018 and I reviewed the results: IMPRESSION: 1. Subtle asymmetric T2 signal and enhancement of the Left Facial Nerve mastoid segment. Questionable asymmetric enhancement also of the left geniculate ganglion. But otherwise normal appearing left internal auditory structures, normal left parotid space, and unremarkable face soft tissues. This suggest inflammation of the nerve such as due to Viral Neuritis. 2. Normal MRI appearance of the brain. No evidence of metastatic disease. 3. Normal intracranial MRA. She had blood work through your office in July which I was able to review: CBC without differential on 04/01/2019 was not available but the CMP was unremarkable, Covid antibody test was nonreactive, TSH was normal at 0.823.  She has not had any one-sided weakness or numbness or tingling, sometimes she has some stinging sensation on the left side of the face.  She may need to hydrate better with water as per her estimate.  Her Past Medical History Is Significant For: Past Medical History:  Diagnosis Date  . Bell's palsy   . Migraine   . Vitamin D deficiency     Her Past Surgical History Is Significant For: Past Surgical History:  Procedure Laterality Date  . CESAREAN SECTION    . TUBAL LIGATION      Her Family History Is Significant For: Family History  Problem Relation Age of Onset  . Hypertension Father   . Aneurysm Father   .  Stroke Father   . Healthy Mother   . Healthy Sister     Her Social History Is Significant For: Social History   Socioeconomic History  . Marital status: Married    Spouse name: Not on file  . Number of children: 3  . Years of education: Not on file  . Highest education level: Master's degree (e.g., MA, MS, MEng, MEd, MSW, MBA)  Occupational History  . Occupation: Product manager: [employer]  . Financial resource strain: Not on file  . Food insecurity    Worry: Not on file     Inability: Not on file  . Transportation needs    Medical: Not on file    Non-medical: Not on file  Tobacco Use  . Smoking status: Never Smoker  . Smokeless tobacco: Never Used  Substance and Sexual Activity  . Alcohol use: Yes    Comment: socially  . Drug use: No  . Sexual activity: Yes  Lifestyle  . Physical activity    Days per week: Not on file    Minutes per session: Not on file  . Stress: Not on file  Relationships  . Social Herbalist on phone: Not on file    Gets together: Not on file    Attends religious service: Not on file    Active member of club or organization: Not on file    Attends meetings of clubs or organizations: Not on file    Relationship status: Not on file  Other Topics Concern  . Not on file  Social History Narrative   Lives with husband, 3 children and granddaughter in a 2 story home.     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     Education: Masters.     Her Allergies Are:  No Known Allergies:   Her Current Medications Are:  Outpatient Encounter Medications as of 08/18/2019  Medication Sig  . phentermine 37.5 MG capsule Take 37.5 mg by mouth every morning.   No facility-administered encounter medications on file as of 08/18/2019.   :   Review of Systems:  Out of a complete 14 point review of systems, all are reviewed and negative with the exception of these symptoms as listed below:  Review of Systems  Neurological:       Pt presents today to discuss her bell's palsy and treatment options. She also wants to discuss work restrictions. She reports that her last neurologist offered her optional speech therapy but she has not completed this.    Objective:  Neurological Exam  Physical Exam Physical Examination:   Vitals:   08/18/19 0958  BP: (!) 163/99  Pulse: 60   General Examination: The patient is a very pleasant 43 y.o. female in no acute distress. She appears well-developed and well-nourished and  well groomed.   HEENT: Normocephalic, atraumatic, pupils are equal, round and reactive to light and accommodation. Extraocular tracking is good without limitation to gaze excursion or nystagmus noted. Normal smooth pursuit is noted. Hearing is grossly intact. Face is asymmetric with Lower motor neuron type left facial weakness noted, no frank Bell's phenomenon, eyelid closure is almost complete. No obvious dysarthria, at times speech is difficult to understand with her mask on, but she has no hypophonia, no voice tremor, airway examination is unremarkable, mild mouth dryness is noted but otherwise nonfocal findings, tongue protrudes centrally in palate elevates symmetrically. Initially felt the vibration sense more  intense on the right forehead as compared to the left forehead. Normal sensation to pinprick bilaterally and normal sensation to temperature bilaterally. There is no lip, neck/head, jaw or voice tremor. Neck is supple with full range of passive and active motion. There are no carotid bruits on auscultation.   Chest: Clear to auscultation without wheezing, rhonchi or crackles noted.  Heart: S1+S2+0, regular and normal without murmurs, rubs or gallops noted.   Abdomen: Soft, non-tender and non-distended with normal bowel sounds appreciated on auscultation.  Extremities: There is no pitting edema in the distal lower extremities bilaterally.  Skin: Warm and dry without trophic changes noted.  Musculoskeletal: exam reveals no obvious joint deformities, tenderness or joint swelling or erythema.   Neurologically:  Mental status: The patient is awake, alert and oriented in all 4 spheres. Her immediate and remote memory, attention, language skills and fund of knowledge are appropriate. There is no evidence of aphasia, agnosia, apraxia or anomia. Speech is clear with normal prosody and enunciation. Thought process is linear. Mood is normal and affect is normal.  Cranial nerves II - XII are as  described above under HEENT exam. In addition: shoulder shrug is normal with equal shoulder height noted. Motor exam: Normal bulk, strength and tone is noted. There is no drift, tremor or rebound. Romberg is negative. Reflexes are 2+ throughout. Babinski: Toes are flexor bilaterally. Fine motor skills and coordination: intact with normal finger taps, normal hand movements, normal rapid alternating patting, normal foot taps and normal foot agility.  Cerebellar testing: No dysmetria or intention tremor on finger to nose testing. Heel to shin is unremarkable bilaterally. There is no truncal or gait ataxia.  Sensory exam: intact to light touch, pinprick, vibration, temperature sense in the upper and lower extremities.  Gait, station and balance: She stands easily. No veering to one side is noted. No leaning to one side is noted. Posture is age-appropriate and stance is narrow based. Gait shows normal stride length and normal pace. No problems turning are noted. Tandem walk is unremarkable.   Assessment and plan:   In summary, [name] is a very pleasant 42 y.o.-year old female with an underlying medical history of of migraine headaches, vitamin D deficiency, and Bell's palsy diagnosed in 2019, whoPresents for a second opinion of her Bell's palsy.  She is advised that her findings are in keeping with Bell's palsy, sometimes it takes longer for the weakness to improve, Unfortunately.  She does believe she has made some progress.  She believes that her job-related stress may be a setback to her progress.  She is also worried about her risk for COVID-19.  She had an antibody test in July which was nonreactive and she is reassured that having Bell's palsy should not pose an additional risk for underlying immune compromise or increased risk for COVID-19.  From the neurological standpoint there is really no reason for any work-related restrictions and she is reassured in that regard.  If she needs a letter to  go back to work without restrictions this should come From the office that initially or currently has written her out of work.  She was agreeable.  She is encouraged to follow-up with your office.  From my end of things, no additional testing in terms of imaging testing or repeat MRI is indicated.  Her neurological exam is otherwise benign.  Nevertheless, since she is worried about a underlying autoimmune disease, I suggested we proceed with some additional blood work in our office  including CBC with differential, ANA, ESRAnd CRP.  I explained to her that we could call her with the result and so long as the results are benign she can follow-up with your office. She reports that she was supposed to get a letter to go back to work from Dr. Serita Grit office.  She is encouraged to follow-up with Dr. Posey Pronto in that regard.  She voiced agreement with the plan, and was agreeable to the blood work today but as I left the room she decided that she would leave and when I asked her if she would want the blood work before she left, she stated that she just wanted to leave, I was going to write my instructions to her in the after visit summary but did not get a chance to do so.  The patient left the office without getting the blood work done and without her written instructions but had voiced agreement to the verbal instructions.  She was encouraged to follow-up with your office.  With her next blood work you could check the above blood tests we were going to do in our office. I encouraged her to stay well-hydrated with water.  I also encouraged her to use her lubricating eyedrops and her ointment at night. I had the opportunity to answer all her questions before she left. Thank you very much for allowing me to participate in the care of this nice patient. If I can be of any further assistance to you please do not hesitate to call me at [phone].  Sincerely,   Star Age, MD, PhD

## 2019-08-18 NOTE — Patient Instructions (Signed)
Verbal instructions given

## 2019-08-18 NOTE — Telephone Encounter (Signed)
Pt called in and stated she would like to apologize for her behavior that she expressed today , she states she felt as if she was being unheard and she really apologizes for that.

## 2019-09-28 ENCOUNTER — Ambulatory Visit (INDEPENDENT_AMBULATORY_CARE_PROVIDER_SITE_OTHER): Payer: BC Managed Care – PPO | Admitting: Family Medicine

## 2019-09-28 ENCOUNTER — Encounter: Payer: Self-pay | Admitting: Family Medicine

## 2019-09-28 VITALS — BP 152/93 | HR 61 | Temp 97.3°F

## 2019-09-28 DIAGNOSIS — R030 Elevated blood-pressure reading, without diagnosis of hypertension: Secondary | ICD-10-CM

## 2019-09-28 DIAGNOSIS — G510 Bell's palsy: Secondary | ICD-10-CM | POA: Diagnosis not present

## 2019-09-28 NOTE — Progress Notes (Signed)
I connected with [name] on 09/29/19 at  2:40 PM EST by video and verified that I am speaking with the correct person using two identifiers.   I discussed the limitations, risks, security and privacy concerns of performing an evaluation and management service by video and the availability of in person appointments. I also discussed with the patient that there may be a patient responsible charge related to this service. The patient expressed understanding and agreed to proceed.  Patient location: Home Provider Location: [place] Participants: Lesleigh Noe and Donato Schultz   Subjective:     [name] is a 44 y.o. female presenting for Establish Care (previous PCP at [facility] and Discuss B/P     HPI   #HTN - just lost her mom to Covid - her father also almost died - has a lot of stress in her life - noticed at the neurologist and dentist her bp was elevated - this is a new issue for her - she has started walking more  - feels this is stress related   #Bell's Palsy - recovering - saw neurology  - still with residual symptoms - when she wakes up - you can understand her, but as the day progresses it can be difficult to understand her - the neurologist - was out of work for a year - symptoms since August 2019 - works as a Pharmacist, 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 she could go back to work - still having pain if she speaks for a long period of time - she is not concerned about going back to work, but feels uncertain how her symptoms will effect her - does not have an option of virtual - did speak with another neurologist - personal friend who spoke with her over the phone - will have flares of her disease - good days and bad days - but no clear signs - is concerned that her job (stress) will worsen her symptoms - has had her hair fall out and reflux - but also concerned that on bad days - she will have worsening symptoms  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   Neurological: Positive for weakness.     Social History   Tobacco Use  Smoking Status Never Smoker  Smokeless Tobacco Never Used        Objective:   BP Readings from Last 3 Encounters:  09/28/19 (!) 152/93  08/18/19 (!) 163/99  10/29/18 122/78   Wt Readings from Last 3 Encounters:  08/18/19 203 lb (92.1 kg)  10/29/18 190 lb (86.2 kg)  08/06/18 199 lb 8 oz (90.5 kg)   BP (!) 152/93 Comment: per patient  Pulse 61 Comment: per patient  Temp (!) 97.3 F (36.3 C) Comment: per patient  LMP 09/27/2019    Physical Exam Constitutional:      Appearance: Normal appearance. She is not ill-appearing.  HENT:     Head: Normocephalic and atraumatic.     Right Ear: External ear normal.     Left Ear: External ear normal.  Eyes:     Conjunctiva/sclera: Conjunctivae normal.  Pulmonary:     Effort: Pulmonary effort is normal. No respiratory distress.  Neurological:     Mental Status: She  is alert.     Comments: Left facial droop. Some slurred speech.   Psychiatric:        Mood and Affect: Affect is tearful.        Behavior: Behavior normal.        Thought Content: Thought content normal.        Judgment: Judgment normal.             Assessment & Plan:   Problem List Items Addressed This Visit      Nervous and Auditory   Left-sided Bell's palsy - Primary    Symptoms >1 year. Reviewed neurology note - no need for additional imaging considered autoimmune work-up but patient left before getting. Long discussion with patient regarding goals and expectations. Recently spoke with a neurologist she trusts and would like a referral to see him for a formal opinion. Discussed that while initially she declined speech now that symptoms have not resolved this  may be helpful. Agree with keeping her out of work while she pursues another opinion and starts therapy. Will complete paperwork.       Relevant Orders   Ambulatory referral to Neurology   Ambulatory referral to Speech Therapy     Other   Elevated blood pressure reading    Was normal 1 year ago. Notes some weight gain. I also suspect some increased stress with office values. Virtual visit today - she will do home monitoring and exercise. Return in 1 month for BP check.           Return in about 4 weeks (around 10/26/2019) for HTN.  Lesleigh Noe, MD

## 2019-09-28 NOTE — Patient Instructions (Signed)
Your blood pressure high.   High blood pressure increases your risk for heart attack and stroke.    Please check your blood pressure 2-4 times a week.   To check your blood pressure 1) Sit in a quiet and relaxed place for 5 minutes 2) Make sure your feet are flat on the ground 3) Consider checking first thing in the morning   Normal blood pressure is less than 140/90 Ideally you blood pressure should be around 120/80  Other ways you can reduce your blood pressure:  1) Regular exercise -- Try to get 150 minutes (30 minutes, 5 days a week) of moderate to vigorous aerobic excercise -- Examples: brisk walking (2.5 miles per hour), water aerobics, dancing, gardening, tennis, biking slower than 10 miles per hour 2) DASH Diet - low fat meats, more fresh fruits and vegetables, whole grains, low salt 3) Quit smoking if you smoke 4) Loose 5-10% of your body weight  DASH Eating Plan DASH stands for "Dietary Approaches to Stop Hypertension." The DASH eating plan is a healthy eating plan that has been shown to reduce high blood pressure (hypertension). It may also reduce your risk for type 2 diabetes, heart disease, and stroke. The DASH eating plan may also help with weight loss. What are tips for following this plan?  General guidelines  Avoid eating more than 2,300 mg (milligrams) of salt (sodium) a day. If you have hypertension, you may need to reduce your sodium intake to 1,500 mg a day.  Limit alcohol intake to no more than 1 drink a day for nonpregnant women and 2 drinks a day for men. One drink equals 12 oz of beer, 5 oz of wine, or 1 oz of hard liquor.  Work with your health care provider to maintain a healthy body weight or to lose weight. Ask what an ideal weight is for you.  Get at least 30 minutes of exercise that causes your heart to beat faster (aerobic exercise) most days of the week. Activities may include walking, swimming, or biking.  Work with your health care provider or  diet and nutrition specialist (dietitian) to adjust your eating plan to your individual calorie needs. Reading food labels   Check food labels for the amount of sodium per serving. Choose foods with less than 5 percent of the Daily Value of sodium. Generally, foods with less than 300 mg of sodium per serving fit into this eating plan.  To find whole grains, look for the word "whole" as the first word in the ingredient list. Shopping  Buy products labeled as "low-sodium" or "no salt added."  Buy fresh foods. Avoid canned foods and premade or frozen meals. Cooking  Avoid adding salt when cooking. Use salt-free seasonings or herbs instead of table salt or sea salt. Check with your health care provider or pharmacist before using salt substitutes.  Do not fry foods. Cook foods using healthy methods such as baking, boiling, grilling, and broiling instead.  Cook with heart-healthy oils, such as olive, canola, soybean, or sunflower oil. Meal planning  Eat a balanced diet that includes: ? 5 or more servings of fruits and vegetables each day. At each meal, try to fill half of your plate with fruits and vegetables. ? Up to 6-8 servings of whole grains each day. ? Less than 6 oz of lean meat, poultry, or fish each day. A 3-oz serving of meat is about the same size as a deck of cards. One egg equals 1 oz. ? 2  servings of low-fat dairy each day. ? A serving of nuts, seeds, or beans 5 times each week. ? Heart-healthy fats. Healthy fats called Omega-3 fatty acids are found in foods such as flaxseeds and coldwater fish, like sardines, salmon, and mackerel.  Limit how much you eat of the following: ? Canned or prepackaged foods. ? Food that is high in trans fat, such as fried foods. ? Food that is high in saturated fat, such as fatty meat. ? Sweets, desserts, sugary drinks, and other foods with added sugar. ? Full-fat dairy products.  Do not salt foods before eating.  Try to eat at least 2  vegetarian meals each week.  Eat more home-cooked food and less restaurant, buffet, and fast food.  When eating at a restaurant, ask that your food be prepared with less salt or no salt, if possible. What foods are recommended? The items listed may not be a complete list. Talk with your dietitian about what dietary choices are best for you. Grains Whole-grain or whole-wheat bread. Whole-grain or whole-wheat pasta. Brown rice. [place]. Bulgur. Whole-grain and low-sodium cereals. Pita bread. Low-fat, low-sodium crackers. Whole-wheat flour tortillas. Vegetables Fresh or frozen vegetables (raw, steamed, roasted, or grilled). Low-sodium or reduced-sodium tomato and vegetable juice. Low-sodium or reduced-sodium tomato sauce and tomato paste. Low-sodium or reduced-sodium canned vegetables. Fruits All fresh, dried, or frozen fruit. Canned fruit in natural juice (without added sugar). Meat and other protein foods Skinless chicken or [place]. Ground chicken or [place]. Pork with fat trimmed off. Fish and seafood. Egg whites. Dried beans, peas, or lentils. Unsalted nuts, nut butters, and seeds. Unsalted canned beans. Lean cuts of beef with fat trimmed off. Low-sodium, lean deli meat. Dairy Low-fat (1%) or fat-free (skim) milk. Fat-free, low-fat, or reduced-fat cheeses. Nonfat, low-sodium ricotta or cottage cheese. Low-fat or nonfat yogurt. Low-fat, low-sodium cheese. Fats and oils Soft margarine without trans fats. Vegetable oil. Low-fat, reduced-fat, or light mayonnaise and salad dressings (reduced-sodium). Canola, safflower, olive, soybean, and sunflower oils. Avocado. Seasoning and other foods Herbs. Spices. Seasoning mixes without salt. Unsalted popcorn and pretzels. Fat-free sweets. What foods are not recommended? The items listed may not be a complete list. Talk with your dietitian about what dietary choices are best for you. Grains Baked goods made with fat, such as croissants, muffins, or  some breads. Dry pasta or rice meal packs. Vegetables Creamed or fried vegetables. Vegetables in a cheese sauce. Regular canned vegetables (not low-sodium or reduced-sodium). Regular canned tomato sauce and paste (not low-sodium or reduced-sodium). Regular tomato and vegetable juice (not low-sodium or reduced-sodium). Angie Fava. Olives. Fruits Canned fruit in a light or heavy syrup. Fried fruit. Fruit in cream or butter sauce. Meat and other protein foods Fatty cuts of meat. Ribs. Fried meat. Berniece Salines. Sausage. Bologna and other processed lunch meats. Salami. Fatback. Hotdogs. Bratwurst. Salted nuts and seeds. Canned beans with added salt. Canned or smoked fish. Whole eggs or egg yolks. Chicken or [place] with skin. Dairy Whole or 2% milk, cream, and half-and-half. Whole or full-fat cream cheese. Whole-fat or sweetened yogurt. Full-fat cheese. Nondairy creamers. Whipped toppings. Processed cheese and cheese spreads. Fats and oils Butter. Stick margarine. Lard. Shortening. Ghee. Bacon fat. Tropical oils, such as coconut, palm kernel, or palm oil. Seasoning and other foods Salted popcorn and pretzels. Onion salt, garlic salt, seasoned salt, table salt, and sea salt. Worcestershire sauce. Tartar sauce. Barbecue sauce. Teriyaki sauce. Soy sauce, including reduced-sodium. Steak sauce. Canned and packaged gravies. Fish sauce. Oyster sauce. Cocktail sauce. Horseradish  that you find on the shelf. Ketchup. Mustard. Meat flavorings and tenderizers. Bouillon cubes. Hot sauce and Tabasco sauce. Premade or packaged marinades. Premade or packaged taco seasonings. Relishes. Regular salad dressings. Where to find more information:  National Heart, Lung, and [place]: https://wilson-eaton.com/  American Heart Association: www.heart.org Summary  The DASH eating plan is a healthy eating plan that has been shown to reduce high blood pressure (hypertension). It may also reduce your risk for type 2 diabetes, heart disease,  and stroke.  With the DASH eating plan, you should limit salt (sodium) intake to 2,300 mg a day. If you have hypertension, you may need to reduce your sodium intake to 1,500 mg a day.  When on the DASH eating plan, aim to eat more fresh fruits and vegetables, whole grains, lean proteins, low-fat dairy, and heart-healthy fats.  Work with your health care provider or diet and nutrition specialist (dietitian) to adjust your eating plan to your individual calorie needs. This information is not intended to replace advice given to you by your health care provider. Make sure you discuss any questions you have with your health care provider. Document Revised: 08/23/2017 Document Reviewed: 09/03/2016 Elsevier Patient Education  2020 [employer].

## 2019-09-29 DIAGNOSIS — C439 Malignant melanoma of skin, unspecified: Secondary | ICD-10-CM | POA: Insufficient documentation

## 2019-09-29 DIAGNOSIS — R030 Elevated blood-pressure reading, without diagnosis of hypertension: Secondary | ICD-10-CM | POA: Insufficient documentation

## 2019-09-29 NOTE — Assessment & Plan Note (Signed)
Was normal 1 year ago. Notes some weight gain. I also suspect some increased stress with office values. Virtual visit today - she will do home monitoring and exercise. Return in 1 month for BP check.

## 2019-09-29 NOTE — Assessment & Plan Note (Signed)
Symptoms >1 year. Reviewed neurology note - no need for additional imaging considered autoimmune work-up but patient left before getting. Long discussion with patient regarding goals and expectations. Recently spoke with a neurologist she trusts and would like a referral to see him for a formal opinion. Discussed that while initially she declined speech now that symptoms have not resolved this may be helpful. Agree with keeping her out of work while she pursues another opinion and starts therapy. Will complete paperwork.

## 2019-09-30 ENCOUNTER — Telehealth: Payer: Self-pay | Admitting: Family Medicine

## 2019-09-30 NOTE — Telephone Encounter (Signed)
Opened in error

## 2019-10-30 ENCOUNTER — Other Ambulatory Visit: Payer: Self-pay

## 2019-10-30 ENCOUNTER — Ambulatory Visit: Payer: BC Managed Care – PPO | Attending: Family Medicine

## 2019-10-30 DIAGNOSIS — R471 Dysarthria and anarthria: Secondary | ICD-10-CM | POA: Diagnosis not present

## 2019-10-30 NOTE — Addendum Note (Signed)
Addended by: Garald Balding B on: 10/30/2019 01:42 PM   Modules accepted: Orders

## 2019-10-30 NOTE — Patient Instructions (Signed)
   Speech Exercises   Repeat 2-3 times, at least 3 times a day   Call the cat "Buttercup" A calendar of [place], [place] Four floors to cover Yellow oil ointment Fellow lovers of felines Catastrophe in [place]' plums The church's chimes chimed Telling time 'til eleven Five valve levers Keep the gate closed Go see that guy Fat cows give milk [employer] Gophers Fat frogs flip freely Kohl's into bed Get that game to [employer] Thick thistles stick together Cinnamon aluminum linoleum Black bugs blood Lovely lemon linament Red leather, yellow leather  Big grocery buggy    Purple baby carriage [facility] Proper copper coffee pot Ripe purple cabbage Three free throws [employer] tackled  [employer] dipped the dessert  [facility] [place] that [employer] of [employer] shrink, shells shouldn't Genius in Estate manager/land agent  [place] Take the tackle box File the flash message Give me five flapjacks Fundamental relatives Dye the pets purple Talking [place] time after time Dark chocolate chunks Political landscape of the kingdom We played yo-yos yesterday Radio broadcast assistant

## 2019-10-30 NOTE — Therapy (Signed)
[facility] [address] [place], [place], [address] Phone: [phone]   Fax:  [phone]  Speech Language Pathology Evaluation  Patient Details  Name: [name] MRN: [id number] Date of Birth: Jun 07, 1976 Referring Provider (SLP): Waunita Schooner, MD   Encounter Date: 10/30/2019  End of Session - 10/30/19 1321    Visit Number  1    Number of Visits  6    Date for SLP Re-Evaluation  12/18/19    SLP Start Time  0803    SLP Stop Time   0846    SLP Time Calculation (min)  43 min    Activity Tolerance  Patient tolerated treatment well       Past Medical History:  Diagnosis Date  . Bell's palsy   . Migraine   . Vitamin D deficiency     Past Surgical History:  Procedure Laterality Date  . CESAREAN SECTION    . Melanoma removed     right hand.  . TUBAL LIGATION    . WISDOM TOOTH EXTRACTION      There were no vitals filed for this visit.  Subjective Assessment - 10/30/19 0808    Subjective  Pt relates to SLP that Bell's Palsy occurred in 2019. Severity is variable.    Currently in Pain?  No/denies         SLP Evaluation [facility] - 10/30/19 [id number]      SLP Visit Information   SLP Received On  10/30/19    Referring Provider (SLP)  Waunita Schooner, MD    Onset Date  August 26,2019    Medical Diagnosis  Bell's Palsy      Subjective   Subjective  Pt rates her talking today 5/10 with 10=best speech with Bell's;       General Information   HPI  Pt with Bell's Palsy since August 2019.       Motor Speech   Overall Motor Speech  Impaired    Respiration  Impaired    Phonation  Normal    Articulation  Impaired    Level of Impairment  Conversation    Intelligibility  Intelligibility reduced    Conversation  75-100% accurate   98%      Likely, in places with incr'd background noise pt will be less-likely to be understood unless she learns how to use speech compensations.               SLP  Education - 10/30/19 1321    Education Details  ST to work on compensations, not eliminate Bell's, HEP for overarticultion and slowed rate    Person(s) Educated  Patient    Methods  Explanation;Demonstration;Verbal cues;Handout    Comprehension  Verbalized understanding;Verbal cues required;Need further instruction         SLP Long Term Goals - 10/30/19 1338      SLP LONG TERM GOAL #1   Title  pt will use speech compensations 80% of the time in 10 minutes simple to mod complex conversation    Time  4    Period  --   sessions, for all LTGs     SLP LONG TERM GOAL #2   Title  pt will report higher score than 18/32 on Communicative Effectiveness Survey    Time  4    Status  New       Plan - 10/30/19 1322    Clinical Impression Statement  Pt presents today with s/sx lt-sided Bell's Palsy, with speech  affected intermittently during the day. Pt rates her communicative effectiveness today 18/32. 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 Pt was told that ST would not alleviate/eliminate the Bell's Palsy but help her compensate for the effects. Pt desired once every other week treatment; SLP agreed. She would benefit from skilled ST to learn and habitualize slower rate of speech and overarticulation during conversation.    Speech Therapy Frequency  --   every other week   Duration  --   4 visits   Treatment/Interventions  Functional tasks;SLP instruction and feedback;Compensatory strategies;Patient/family education;Internal/external aids    Potential to Achieve Goals  Good    Potential Considerations  Severity of impairments    SLP Home Exercise Plan  provided today    Consulted and Agree with Plan of Care  Patient       Patient will benefit from skilled therapeutic intervention in order to improve the following deficits and impairments:   Dysarthria and anarthria    Problem List Patient Active Problem List   Diagnosis Date Noted  . Elevated blood pressure  reading 09/29/2019  . Malignant melanoma ([place]) 09/29/2019  . Left-sided Bell's palsy 10/29/2018    [facility] ,MS, CCC-SLP  10/30/2019, 1:39 PM  [facility] [address] [place], [place], [address] Phone: [phone]   Fax:  [phone]  Name: KRISTANA EBERLY MRN: [id number] Date of Birth: [birth year]

## 2019-11-02 ENCOUNTER — Telehealth: Payer: Self-pay | Admitting: Family Medicine

## 2019-11-02 DIAGNOSIS — Z0279 Encounter for issue of other medical certificate: Secondary | ICD-10-CM

## 2019-11-02 NOTE — Telephone Encounter (Signed)
Pt dropped off disability forms 2 forms  1) describe the disabling illness 2) disability eligibility review  In dr Cody's in box

## 2019-11-02 NOTE — Telephone Encounter (Signed)
Paperwork returned.   End date based on Speech Language plan for 8 weeks of therapy.   If patient needs more time, will recommend appointment to discuss.

## 2019-11-03 NOTE — Telephone Encounter (Signed)
Pt aware paperwork is ready for pick up Copy for scan Copy for billing Copy for pt

## 2019-11-10 ENCOUNTER — Ambulatory Visit: Payer: BC Managed Care – PPO | Admitting: Speech Pathology

## 2019-11-11 ENCOUNTER — Other Ambulatory Visit: Payer: Self-pay

## 2019-11-11 ENCOUNTER — Ambulatory Visit: Payer: BC Managed Care – PPO

## 2019-11-11 DIAGNOSIS — R471 Dysarthria and anarthria: Secondary | ICD-10-CM | POA: Diagnosis not present

## 2019-11-11 NOTE — Therapy (Signed)
[facility] [address] [place], [place], [address] Phone: [phone]   Fax:  [phone]  Speech Language Pathology Treatment  Patient Details  Name: [name] MRN: [id number] Date of Birth: Jul 13, 1976 Referring Provider (SLP): Waunita Schooner, MD   Encounter Date: 11/11/2019  End of Session - 11/11/19 1530    Visit Number  2    Number of Visits  6    Date for SLP Re-Evaluation  12/18/19    SLP Start Time  61    SLP Stop Time   1400    SLP Time Calculation (min)  41 min    Activity Tolerance  Patient tolerated treatment well       Past Medical History:  Diagnosis Date  . Bell's palsy   . Migraine   . Vitamin D deficiency     Past Surgical History:  Procedure Laterality Date  . CESAREAN SECTION    . Melanoma removed     right hand.  . TUBAL LIGATION    . WISDOM TOOTH EXTRACTION      There were no vitals filed for this visit.  Subjective Assessment - 11/11/19 1321    Subjective  Pt on meds that relax the lt jaw currently. "The first couple of days I exaggerated the sentences it hurt."    Currently in Pain?  No/denies            ADULT SLP TREATMENT - 11/11/19 1323      General Information   Behavior/Cognition  Alert;Cooperative;Pleasant mood      Cognitive-Linquistic Treatment   Treatment focused on  Dysarthria    Skilled Treatment  Conversation for first 15 minutes was 95%+ with careful listening from this trained listener. Pt related that the oral muscle pain appeared to be from the muscles "clenching" due to not moving with the other muscles of her face. Pt relates that when she has muscle relaxer for her lt facial muscles her speech is much more intelligible. Pt states that her speech is also harder to understand when she is fatigued, and those are the two situations in which her speech intelligibility is affected. Pt did not know if each of these situations resulted in differing ways  her speech is difficult to understand, just that both situations make it harder for her to be understood. Warm compresses, massage assists in the past with giving pt more ROM and clearer. SLP had pt practice overarticulation with 5-7 word sentences with 90% success.  Pt req'd cues to "naturalize" the rate instead of it sounding robotic. After this pt made speech more natural sounding and this was functional. Intelligibliity 100%.SLP encouraged pt to pick 2 30-minute periods to focus on reduced rate/overrticulation in conversation same time each day.      Assessment / Recommendations / Plan   Plan  Continue with current plan of care      Progression Toward Goals   Progression toward goals  Progressing toward goals       SLP Education - 11/11/19 1404    Education Details  med options to talk about with nerologist, what overarticulating does for speech intelligibilty and why    Person(s) Educated  Patient    Methods  Explanation;Demonstration;Verbal cues    Comprehension  Verbalized understanding;Returned demonstration;Need further instruction;Verbal cues required         SLP Long Term Goals - 11/11/19 1531      SLP LONG TERM GOAL #1   Title  pt will use  speech compensations 80% of the time in 10 minutes simple to mod complex conversation    Time  4    Period  --   sessions, for all LTGs   Status  On-going      SLP LONG TERM GOAL #2   Title  pt will report higher score than 18/32 on Communicative Effectiveness Survey    Time  4    Status  On-going       Plan - 11/11/19 1530    Clinical Impression Statement  Pt presents today with cont'd/more severe s/sx lt-sided Bell's Palsy than during eval, with speech affected to various degrees during the day. SLP focused today on rate reduction to improve speech inteligiblity.  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 Pt was told that ST would not alleviate/eliminate the Bell's Palsy but help her compensate for the  effects. Pt desired once every other week treatment; SLP agreed. She would benefit from skilled ST to learn and habitualize slower rate of speech and overarticulation during conversation.    Speech Therapy Frequency  --   every other week   Duration  --   4 visits   Treatment/Interventions  Functional tasks;SLP instruction and feedback;Compensatory strategies;Patient/family education;Internal/external aids    Potential to Achieve Goals  Good    Potential Considerations  Severity of impairments    SLP Home Exercise Plan  provided today    Consulted and Agree with Plan of Care  Patient       Patient will benefit from skilled therapeutic intervention in order to improve the following deficits and impairments:   Dysarthria and anarthria    Problem List Patient Active Problem List   Diagnosis Date Noted  . Elevated blood pressure reading 09/29/2019  . Malignant melanoma ([place]) 09/29/2019  . Left-sided Bell's palsy 10/29/2018    [facility] ,MS, CCC-SLP  11/11/2019, 3:32 PM  [facility] [address] [place], [place], [address] Phone: [phone]   Fax:  [phone]   Name: [name] MRN: [id number] Date of Birth: [birth year]

## 2019-11-11 NOTE — Patient Instructions (Signed)
   Please choose two 30-minutes periods you can target slower more enunciated speech during the day.

## 2019-11-19 ENCOUNTER — Encounter: Payer: Self-pay | Admitting: Family Medicine

## 2019-11-19 LAB — HM PAP SMEAR: HM Pap smear: NEGATIVE

## 2019-11-24 ENCOUNTER — Other Ambulatory Visit: Payer: Self-pay

## 2019-11-24 ENCOUNTER — Ambulatory Visit: Payer: BC Managed Care – PPO | Attending: Family Medicine | Admitting: Speech Pathology

## 2019-11-24 DIAGNOSIS — R471 Dysarthria and anarthria: Secondary | ICD-10-CM | POA: Diagnosis present

## 2019-11-24 NOTE — Therapy (Signed)
[facility] [address] [place], [place], [address] Phone: [phone]   Fax:  [phone]  Speech Language Pathology Treatment  Patient Details  Name: [name] MRN: [id number] Date of Birth: 1976/02/16 Referring Provider (SLP): Waunita Schooner, MD   Encounter Date: 11/24/2019  End of Session - 11/24/19 2007    Visit Number  3    Number of Visits  6    Date for SLP Re-Evaluation  12/18/19    SLP Start Time  [id number]    SLP Stop Time   [id number]    SLP Time Calculation (min)  40 min       Past Medical History:  Diagnosis Date  . Bell's palsy   . Migraine   . Vitamin D deficiency     Past Surgical History:  Procedure Laterality Date  . CESAREAN SECTION    . Melanoma removed     right hand.  . TUBAL LIGATION    . WISDOM TOOTH EXTRACTION      There were no vitals filed for this visit.  Subjective Assessment - 11/24/19 1107    Subjective  "So today, this is me. This is 98% my normal voice."    Currently in Pain?  No/denies            ADULT SLP TREATMENT - 11/24/19 1107      General Information   Behavior/Cognition  Alert;Cooperative;Pleasant mood      Treatment Provided   Treatment provided  Cognitive-Linquistic      Pain Assessment   Pain Assessment  No/denies pain      Cognitive-Linquistic Treatment   Treatment focused on  Dysarthria    Skilled Treatment  Patient reported speech today as "98%" of normal. She has been taking muscle relaxers which she feels helps her speech but it is not always predicitable. She does not wish to take this medication long term; over the weekend she did not take it and noticed when talking to a friend, "in mid-sentence it went back to that slur." Pt reports she has difficulty habituating slowed rate, so SLP worked with pt on this in conversation today. With SLP modeling slowed rate, pt used slower rate and overarticulation in mod complex conversation with occasional  min A for awareness of accelerating rate. 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 Pt read one of her poems at her "normal speed," then read at reduced rate with occasional min visual cues (~2/3 speed). SLP instructed pt that she could practice this way at home using a timer to help her monitor her rate of speed for reading a blog post or writing excerpt. Pt returned to tx room after leaving to check out: "The slur is back," with notable decrease intelligibility. Pt reported taking her medication this morning.       Assessment / Recommendations / Plan   Plan  Continue with current plan of care      Progression Toward Goals   Progression toward goals  Progressing toward goals       SLP Education - 11/24/19 2006    Education Details  strategies to monitor slowing rate in reading/speech tasks    Person(s) Educated  Patient    Methods  Explanation    Comprehension  Verbalized understanding         SLP Long Term Goals - 11/24/19 2010      SLP LONG TERM GOAL #1  Title  pt will use speech compensations 80% of the time in 10 minutes simple to mod complex conversation    Time  3    Period  --   sessions, for all LTGs   Status  On-going      SLP LONG TERM GOAL #2   Title  pt will report higher score than 18/32 on Communicative Effectiveness Survey    Time  3    Status  On-going       Plan - 11/24/19 2008    Clinical Impression Statement  Pt with near-normal speech today per her report, although decreased intelligiblity variable secondary to L sided Bell's Palsy. After session, pt returned to tx room briefly to demonstrate slurred speech, which onset rapidly after ST session today. SLP continued work with pt today on using slowed rate and overarticulation as compensations when her intelligiblity is affected. 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 Pt was told that ST would not alleviate/eliminate  the Bell's Palsy but help her compensate for the effects. Pt desired once every other week treatment; SLP agreed. She would benefit from skilled ST to learn and habitualize slower rate of speech and overarticulation during conversation.    Speech Therapy Frequency  --   every other week   Duration  --   4 visits   Treatment/Interventions  Functional tasks;SLP instruction and feedback;Compensatory strategies;Patient/family education;Internal/external aids    Potential to Achieve Goals  Good    Potential Considerations  Severity of impairments    SLP Home Exercise Plan  provided today    Consulted and Agree with Plan of Care  Patient       Patient will benefit from skilled therapeutic intervention in order to improve the following deficits and impairments:   Dysarthria and anarthria    Problem List Patient Active Problem List   Diagnosis Date Noted  . Elevated blood pressure reading 09/29/2019  . Malignant melanoma ([place]) 09/29/2019  . Left-sided Bell's palsy 10/29/2018   Deneise Lever, [place], CCC-SLP Speech-Language Pathologist  Aliene Altes 11/24/2019, 8:11 PM  [facility] [address] [place], [place], [address] Phone: [phone]   Fax:  [phone]   Name: [name] MRN: [id number] Date of Birth: 1976/07/09

## 2019-11-24 NOTE — Patient Instructions (Signed)
The best way to habituate and naturalize a slower rate is to practice. Practice reading out loud something familiar to you, like your blog, or your book and see if you can strike the balance between slow rate and keeping up with your train of thought. The goal is not to change "your voice" but to find a slower version of "your voice."  You can try reading one of your poems or blog entries and time it on your phone. Then read it again, aiming for about 2/3 of your original speed (2 min --> 3 min for the same passage).

## 2019-12-08 ENCOUNTER — Ambulatory Visit: Payer: BC Managed Care – PPO

## 2019-12-08 ENCOUNTER — Other Ambulatory Visit: Payer: Self-pay

## 2019-12-08 DIAGNOSIS — R471 Dysarthria and anarthria: Secondary | ICD-10-CM | POA: Diagnosis not present

## 2019-12-08 NOTE — Therapy (Signed)
[facility] [address] [place], [place], [address] Phone: [phone]   Fax:  [phone]  Speech Language Pathology Treatment  Patient Details  Name: [name] MRN: [id number] Date of Birth: 06/01/1976 Referring Provider (SLP): Waunita Schooner, MD   Encounter Date: 12/08/2019  End of Session - 12/08/19 1417    Visit Number  4    Number of Visits  6    Date for SLP Re-Evaluation  12/18/19    SLP Start Time  0935    SLP Stop Time   1015    SLP Time Calculation (min)  40 min    Activity Tolerance  Patient tolerated treatment well       Past Medical History:  Diagnosis Date  . Bell's palsy   . Migraine   . Vitamin D deficiency     Past Surgical History:  Procedure Laterality Date  . CESAREAN SECTION    . Melanoma removed     right hand.  . TUBAL LIGATION    . WISDOM TOOTH EXTRACTION      There were no vitals filed for this visit.  Subjective Assessment - 12/08/19 0945    Subjective  Pt walks in with lt-sided facial droop inculding eyelid.    Currently in Pain?  No/denies            ADULT SLP TREATMENT - 12/08/19 0955      General Information   Behavior/Cognition  Alert;Cooperative;Pleasant mood      Treatment Provided   Treatment provided  Cognitive-Linquistic      Cognitive-Linquistic Treatment   Treatment focused on  Dysarthria    Skilled Treatment  Pt with definite mild- occasional mod dysarthria affecting speech intelligibility due to careful listening necessary by SLP 15% of the time. SLP focused today on education on managing fatigue and stress dueing the workday, as pt is scheduled to go back to work on 01-04-20.  SLP also brought up possibility of a change in vocation and vocational rehab assistance. Pt stated she has thought of this option (change in vocation) prior to today after a recent MD appointment.  Pt to make next appointmetn in 4 weeks - she stated she may find she no  longer needs ST serives and if this is the case she will call to cancel that session.       Assessment / Recommendations / Plan   Plan  Continue with current plan of care      Progression Toward Goals   Progression toward goals  Progressing toward goals       SLP Education - 12/08/19 1416    Education Details  vocationla rehab basics (pt politely refused contact intormation for voc rehab)    Person(s) Educated  Patient    Methods  Explanation    Comprehension  Verbalized understanding         SLP Long Term Goals - 12/08/19 1419      SLP LONG TERM GOAL #1   Title  pt will use speech compensations 80% of the time in 10 minutes simple to mod complex conversation    Time  2    Period  --   sessions, for all LTGs   Status  On-going      SLP LONG TERM GOAL #2   Title  pt will report higher score than 18/32 on Communicative Effectiveness Survey    Time  2    Status  On-going       Plan -  12/08/19 1418    Clinical Impression Statement  Pt presents today with cont'd/more severe s/sx lt-sided Bell's Palsy than during eval, with speech affected to various degrees during the session. SLP focused today on compensations related to fatigue and voice conservation management.  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 Pt was told that ST would not alleviate/eliminate the Bell's Palsy but help her compensate for the effects. Pt desired next visit in 4 weeks; SLP agreed. She would benefit from skilled ST to learn and habitualize slower rate of speech and overarticulation during conversation.    Speech Therapy Frequency  --   every other week   Duration  --   4 visits   Treatment/Interventions  Functional tasks;SLP instruction and feedback;Compensatory strategies;Patient/family education;Internal/external aids    Potential to Achieve Goals  Good    Potential Considerations  Severity of impairments    SLP  Home Exercise Plan  provided today    Consulted and Agree with Plan of Care  Patient       Patient will benefit from skilled therapeutic intervention in order to improve the following deficits and impairments:   Dysarthria and anarthria    Problem List Patient Active Problem List   Diagnosis Date Noted  . Elevated blood pressure reading 09/29/2019  . Malignant melanoma ([place]) 09/29/2019  . Left-sided Bell's palsy 10/29/2018    [facility] ,[place], CCC-SLP  12/08/2019, 2:19 PM  [facility] [address] [place], [place], [address] Phone: [phone]   Fax:  [phone]   Name: [name] MRN: [id number] Date of Birth: 1976-09-09

## 2019-12-11 ENCOUNTER — Ambulatory Visit (INDEPENDENT_AMBULATORY_CARE_PROVIDER_SITE_OTHER): Payer: BC Managed Care – PPO | Admitting: Primary Care

## 2019-12-11 DIAGNOSIS — J309 Allergic rhinitis, unspecified: Secondary | ICD-10-CM | POA: Diagnosis not present

## 2019-12-11 NOTE — Progress Notes (Signed)
Subjective:    Patient ID: [name], female    DOB: 01-11-1976, 44 y.o.   MRN: [id number]  HPI  Virtual Visit via Video Note  I connected with [name] on 12/11/19 at  3:00 PM EDT by a video enabled telemedicine application and verified that I am speaking with the correct person using two identifiers.  Location: Patient: Home Provider: Office   I discussed the limitations of evaluation and management by telemedicine and the availability of in person appointments. The patient expressed understanding and agreed to proceed.  History of Present Illness:  [name] is a 44 year old female patient of Dr. Einar Pheasant with a history of Bell's Palsy, elevated blood pressure reading who presents today with a chief complaint of nasal congestion.  She also reports intermittent cough, cool sensation to her chest. Symptoms began 9 days ago. She's feeling better today, her nasal congestion and headaches have resolved.  She's been taking Mucinex Sinus without improvement, also started [facility] medication earlier this week with improvement. She denies known exposure to Covid-19, although she did travel to the beach and returned nearly two weeks ago. While at the beach she stuck just with her family, sanitized constantly. Her daughters had the same symptoms but their symptoms resolved within a few days.   She just purchased a [employer] and nasal spray and plans on using these today. She denies diarrhea, loss of taste/smell.    Observations/Objective:  Alert and oriented. Appears well, not sickly. No distress. Speaking in complete sentences. No cough. Sniffling of her nose during visit.  Assessment and Plan:  Suspect viral or allergy etiology, could be Covid-19 given recent travel to the beach, could also be allergy related given environmental changes from [place] back home.   She is on day 9 of symptoms, tomorrow makes 10, so she would technically be out of quarantine by  tomorrow if she did have Covid-19.  Recommended to move forward with neti pot and nasal spray as she does have rhinorrhea. She is feeling better which is reassuring.  She will call next week if symptoms progress. Return precautions provided.    Follow Up Instructions:  Use the Neti Pot and nasal spray as discussed.  Continue the allergy medication daily.  Please call me if you develop sinus pressure, notice thick/green nasal discharge, fevers.  It was a pleasure meeting you! Allie Bossier, NP-C    I discussed the assessment and treatment plan with the patient. The patient was provided an opportunity to ask questions and all were answered. The patient agreed with the plan and demonstrated an understanding of the instructions.   The patient was advised to call back or seek an in-person evaluation if the symptoms worsen or if the condition fails to improve as anticipated.    Pleas Koch, NP    Review of Systems  Constitutional: Negative for chills, fatigue and fever.  HENT: Positive for rhinorrhea. Negative for congestion, postnasal drip and sore throat.   Respiratory: Positive for cough.   Allergic/Immunologic: Positive for environmental allergies.       Past Medical History:  Diagnosis Date  . Bell's palsy   . Migraine   . Vitamin D deficiency      Social History   Socioeconomic History  . Marital status: Married    Spouse name: Not on file  . Number of children: 3  . Years of education: Not on file  . Highest education level: Master's degree (e.g.,  MA, MS, MEng, MEd, MSW, [facility])  Occupational History  . Occupation: Product manager: [employer]  Tobacco Use  . Smoking status: Never Smoker  . Smokeless tobacco: Never Used  Substance and Sexual Activity  . Alcohol use: Yes    Comment: 1 to 2 glasses of wine every other night  . Drug use: No  . Sexual activity: Yes    Birth control/protection: None  Other Topics Concern  . Not on file    Social History Narrative   Lives with husband, 3 children and granddaughter in a 2 story home.     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     Education: Masters.    Social Determinants of Health   Financial Resource Strain:   . Difficulty of Paying Living Expenses:   Food Insecurity:   . Worried About Charity fundraiser in the Last Year:   . Arboriculturist in the Last Year:   Transportation Needs:   . Film/video editor (Medical):   Marland Kitchen Lack of Transportation (Non-Medical):   Physical Activity:   . Days of Exercise per Week:   . Minutes of Exercise per Session:   Stress:   . Feeling of Stress :   Social Connections:   . Frequency of Communication with Friends and Family:   . Frequency of Social Gatherings with Friends and Family:   . Attends Religious Services:   . Active Member of Clubs or Organizations:   . Attends Archivist Meetings:   Marland Kitchen Marital Status:   Intimate Partner Violence:   . Fear of Current or Ex-Partner:   . Emotionally Abused:   Marland Kitchen Physically Abused:   . Sexually Abused:     Past Surgical History:  Procedure Laterality Date  . CESAREAN SECTION    . Melanoma removed     right hand.  . TUBAL LIGATION    . WISDOM TOOTH EXTRACTION      Family History  Problem Relation Age of Onset  . Hypertension Father   . Aneurysm Father   . Stroke Father 63  . Other Sister        low iron  . Stomach cancer Maternal Grandfather     No Known Allergies  Current Outpatient Medications on File Prior to Visit  Medication Sig Dispense Refill  . BLACK ELDERBERRY,BERRY-FLOWER, PO Take by mouth.    . cyanocobalamin 100 MCG tablet Take 100 mcg by mouth daily.    . Multiple Vitamin (MULTIVITAMIN) tablet Take 1 tablet by mouth daily.    . phentermine 37.5 MG capsule Take 37.5 mg by mouth every morning.    . Zinc Sulfate (ZINC 15 PO) Take by mouth.     No current facility-administered medications on file prior to visit.    There  were no vitals taken for this visit.   Objective:   Physical Exam  Constitutional: She is oriented to person, place, and time. She appears well-nourished. She does not appear ill.  HENT:  Rhinorrhea during exam  Respiratory: Effort normal.  No cough during exam  Neurological: She is alert and oriented to person, place, and time.           Assessment & Plan:

## 2019-12-11 NOTE — Patient Instructions (Signed)
Use the Neti Pot and nasal spray as discussed.  Continue the allergy medication daily.  Please call me if you develop sinus pressure, notice thick/green nasal discharge, fevers.  It was a pleasure meeting you! Allie Bossier, NP-C

## 2019-12-14 ENCOUNTER — Telehealth: Payer: Self-pay | Admitting: *Deleted

## 2019-12-14 DIAGNOSIS — J019 Acute sinusitis, unspecified: Secondary | ICD-10-CM

## 2019-12-14 MED ORDER — AMOXICILLIN-POT CLAVULANATE 875-125 MG PO TABS: 1.0000 | ORAL_TABLET | Freq: Two times a day (BID) | ORAL | 0 refills | Status: DC

## 2019-12-14 NOTE — Telephone Encounter (Signed)
Noted. Please call patient and notify her that I'm happy to treat for a presumed sinusitis. Best practice is to treat with Augmentin antibiotics which are stronger, so I'll get this sent over to her pharmacy. Please notify patient.

## 2019-12-14 NOTE — Telephone Encounter (Signed)
Patient left a voicemail stating that she did a visit with Allie Bossier NP Friday. Patient stated that she was advised to call back if she was not doing better today. Patient stated that she is feeling the same and there has not been any improvement. Patient stated that she is tired of feeling bad and needs something to get her symptoms cleared up. Patient stated that she would like a Zpak. Pharmacy [facility]

## 2019-12-14 NOTE — Telephone Encounter (Signed)
Called patient back and was advised that the cough is productive at times and green. Patient denies a fever, just feels bad. Patient stated that this is going into her third week.  Patient stated that she started with head congestion and now has chest congestion. Patient stated that she has a history of Bell's palsy and it is causing greater pain on the left side of her face.

## 2019-12-15 NOTE — Telephone Encounter (Signed)
Patient has been notified that Rx has been sent into the pharmacy and she verbalized understanding.

## 2020-04-13 IMAGING — MR MR MRA HEAD W/O CM
13 of 14 series · 38 of 48 positions shown · IV contrast (Multihance 18 ml)
Comparison: None.

CLINICAL DATA: 42-year-old female with Bell's palsy symptoms since
May 20, 2018. Left face numbness.
History of melanoma.

Creatinine was obtained on site at [HOSPITAL] at [HOSPITAL].
Results: Creatinine 0.7 mg/dL.
EXAM:
MRI HEAD WITHOUT AND WITH CONTRAST
MRA HEAD WITHOUT CONTRAST
TECHNIQUE: Multiplanar, multiecho pulse sequences of the brain and surrounding
structures were obtained without and with intravenous contrast.
Angiographic images of the head were obtained using MRA technique
without contrast.
CONTRAST:  18mL MULTIHANCE GADOBENATE DIMEGLUMINE 529 MG/ML IV SOLN

[Series 9: T1 · sagittal · 4.0mm · 0.75mm/px · 1 of 31 slices shown (1 of 3)]
[im 1/31]
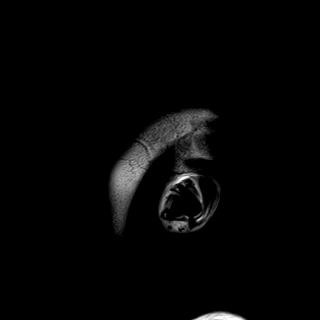

[Series 10: tof_[id number]_multi-slab · axial · 0.6mm · 0.26mm/px · z∈[-145,-120]mm · 3 of 149 slices shown]
[im 1/149]
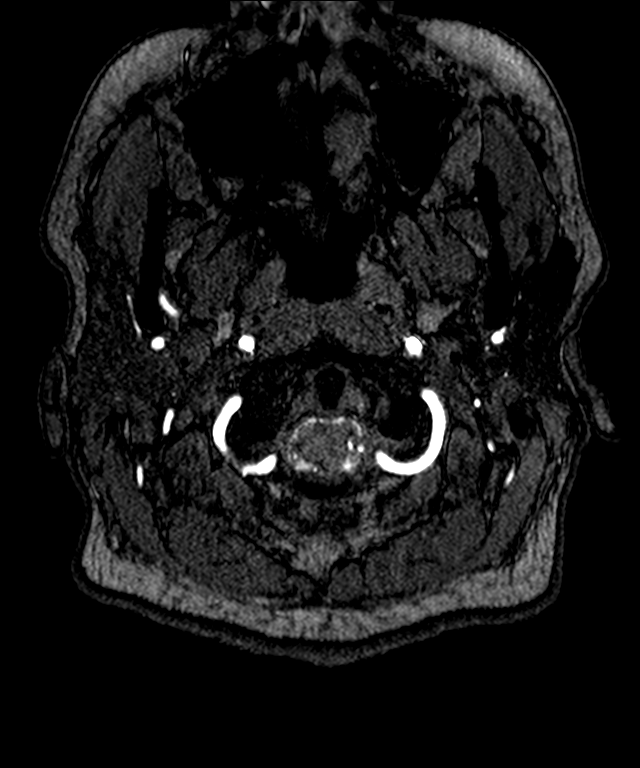
[im 22/149]
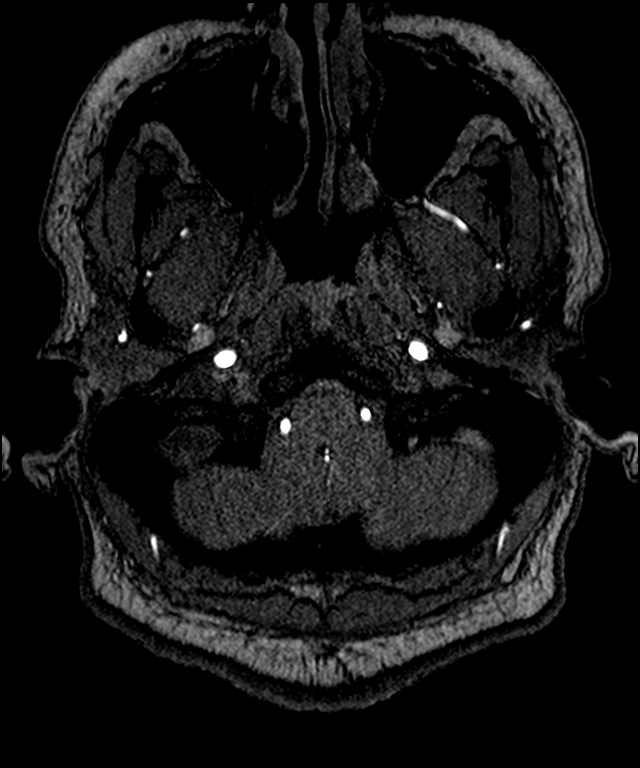
[im 43/149]
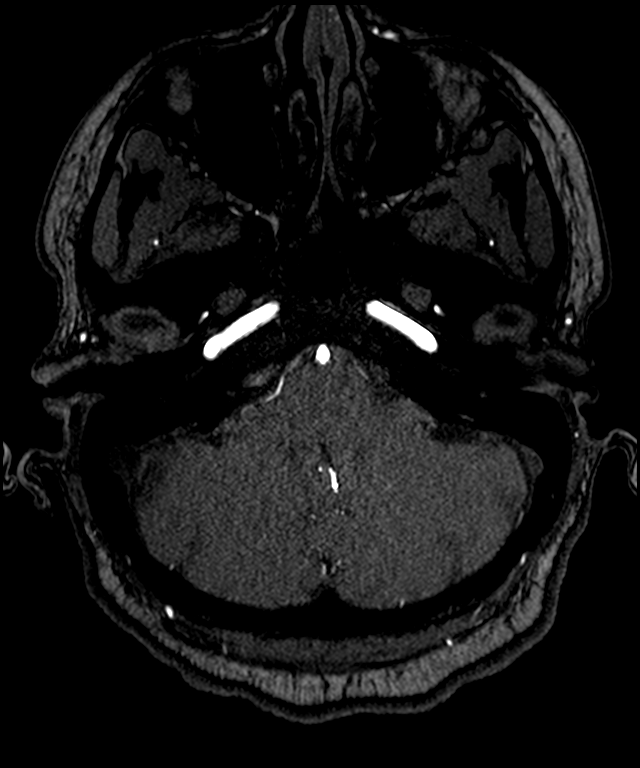

[Series 15: DWI · axial · 3.0mm · 1.44mm/px · z∈[-147,-7]mm · 5 of 88 slices shown (1 of 4)]
[im 1/88]
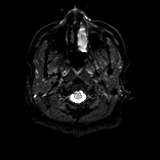
[im 22/88]
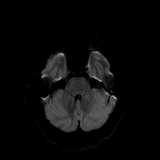
[im 44/88]
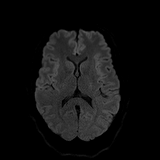
[im 66/88]
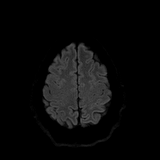
[im 88/88]
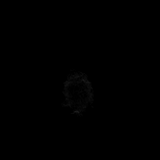

[Series 16: DWI · axial · 3.0mm · 1.44mm/px · z∈[-147,-7]mm · 2 of 44 slices shown (2 of 4)]
[im 1/44]
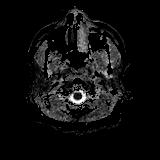
[im 44/44]
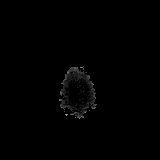

[Series 17: DWI · coronal · 5.0mm · 1.44mm/px · 3 of 60 slices shown (3 of 4)]
[im 1/60]
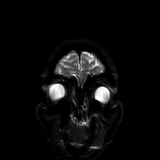
[im 30/60]
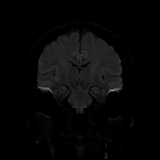
[im 60/60]
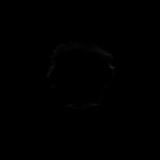

[Series 18: DWI · coronal · 5.0mm · 1.44mm/px · 2 of 30 slices shown (4 of 4)]
[im 1/30]
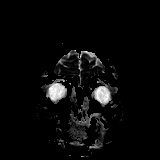
[im 30/30]
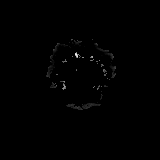

[Series 19: T2 · axial · 4.0mm · 0.36mm/px · 1 of 27 slices shown]
[im 1/27]
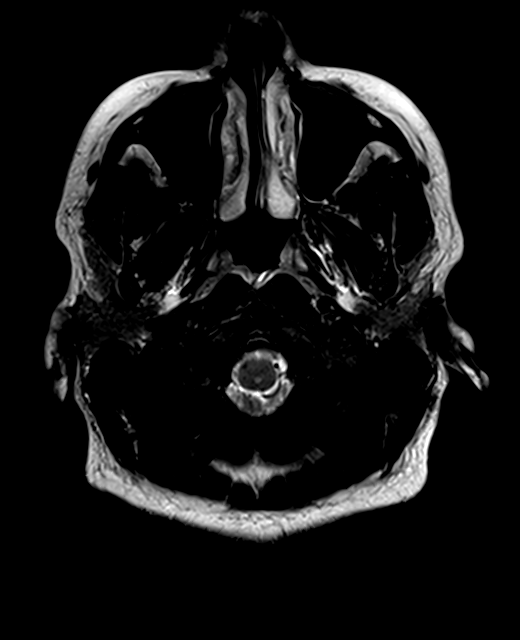

[Series 20: FLAIR · axial · 3.0mm · 0.72mm/px · 1 of 26 slices shown]
[im 1/26]
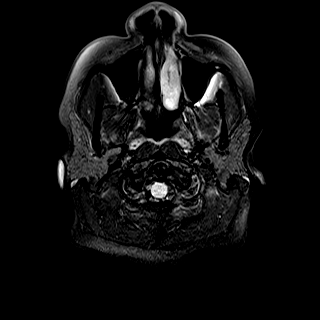

[Series 23: T1 · axial · 1.0mm · 0.90mm/px · z∈[-147,-6]mm · 7 of 144 slices shown (2 of 3)]
[im 1/144]
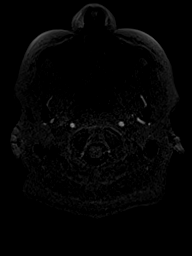
[im 24/144]
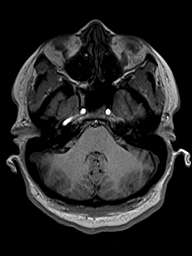
[im 48/144]
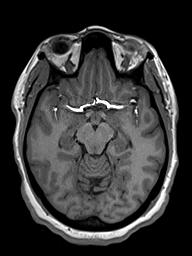
[im 72/144]
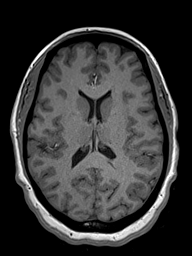
[im 96/144]
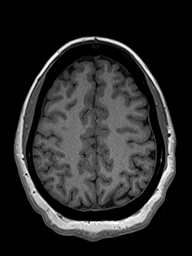
[im 120/144]
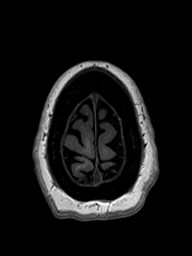
[im 144/144]
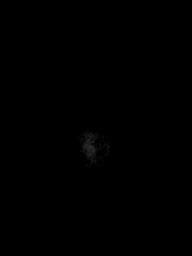

[Series 24: T2 post-contrast · coronal · 4.0mm · 0.36mm/px · 2 of 34 slices shown]
[im 1/34]
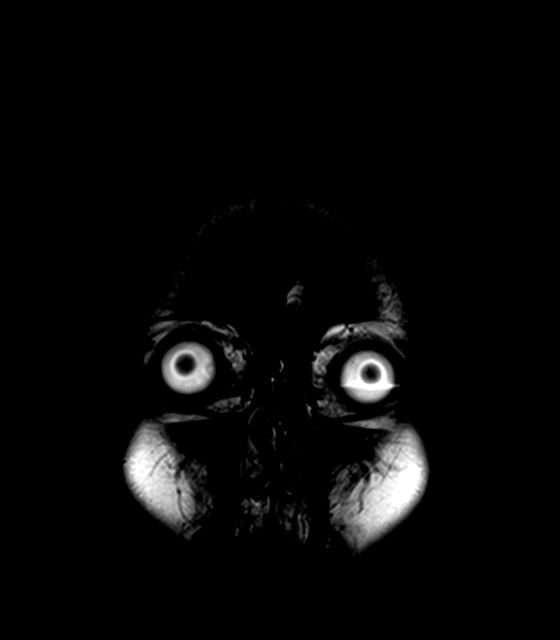
[im 34/34]
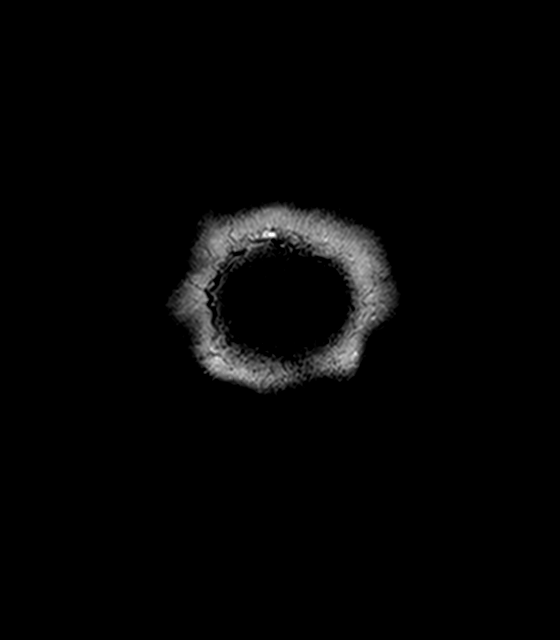

[Series 25: T1 · axial · 1.0mm · 0.90mm/px · z∈[-147,-6]mm · 7 of 144 slices shown (3 of 3)]
[im 1/144]
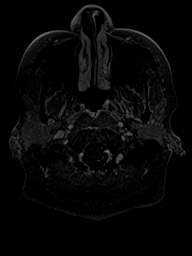
[im 24/144]
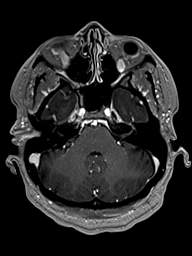
[im 48/144]
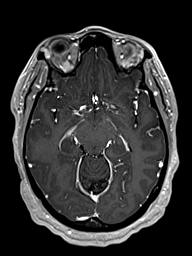
[im 72/144]
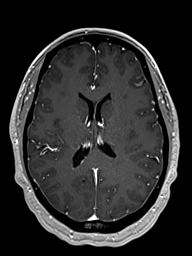
[im 96/144]
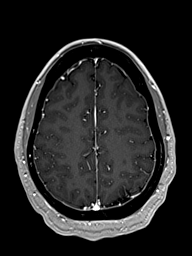
[im 120/144]
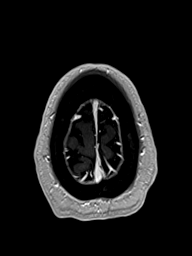
[im 144/144]
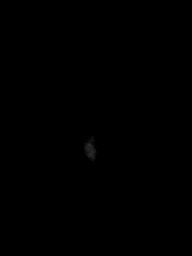

[Series 26: T1 post-contrast · coronal · 4.0mm · 0.72mm/px · 2 of 34 slices shown (1 of 2)]
[im 1/34]
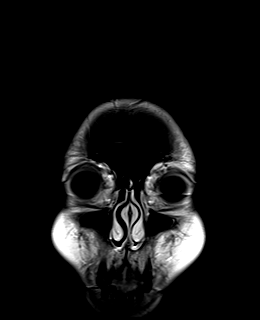
[im 34/34]
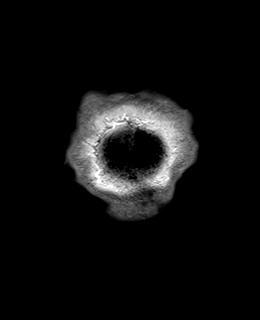

[Series 27: T1 post-contrast · sagittal · 4.0mm · 0.75mm/px · 2 of 31 slices shown (2 of 2)]
[im 1/31]
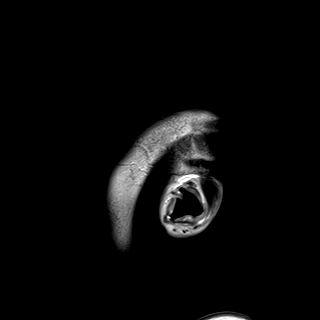
[im 31/31]
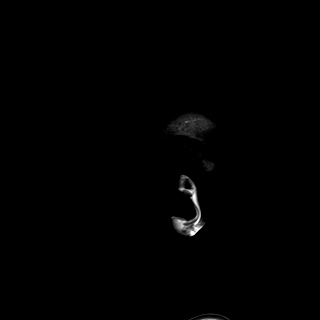

[38 of 48 positions shown; findings below may reference images not displayed]

FINDINGS: MRI HEAD FINDINGS

Brain: Normal cerebral volume. No restricted diffusion to suggest
acute infarction. No midline shift, mass effect, evidence of mass
lesion, ventriculomegaly, extra-axial collection or acute
intracranial hemorrhage. Cervicomedullary junction and pituitary are
within normal limits.

Gray and white matter signal is within normal limits throughout the
brain. No encephalomalacia or chronic cerebral blood products
identified. No abnormal brain enhancement identified. No dural
thickening. No leptomeningeal enhancement identified.

Prior to contrast visible internal auditory structures appear
symmetric and within normal limits (series 19, image 5). However,
there is asymmetric T2 hyperintensity and enhancement noted at the
vertical mastoid segment of the left facial nerve (series 24, image
17 and series 25, image 20. Image 25 of series 25 also raises the
possibility of asymmetric enhancement of the left facial nerve
anterior geniculate ganglion, uncertain. No other abnormal internal
auditory enhancement. The cisternal bilateral 7th and 8th cranial
nerves appear normal. Mastoid air cells are clear.

The left parotid gland appears within normal limits. No soft tissue
mass identified at the stylomastoid foramen.

Vascular: Major intracranial vascular flow voids are preserved. The
major dural venous sinuses are enhancing and appear to be patent.

Skull and upper cervical spine: Negative visible cervical spine.
Visualized bone marrow signal is within normal limits.

Sinuses/Orbits: Negative orbits. Trace paranasal sinus mucosal
thickening.

Other: Mastoids are clear. Scalp and face soft tissues appear
symmetric and negative.

MRA HEAD FINDINGS

Antegrade flow in the posterior circulation. Codominant distal
vertebral arteries appear normal. Normal PICA origins and
vertebrobasilar junction. Normal basilar artery without stenosis.
SCA, AICA and PCA origins appear normal. Small posterior
communicating arteries are present. Bilateral PCA branches are
within normal limits.

No abnormal flow signal about the left IAC or skull base.

Antegrade flow in both ICA siphons. No siphon stenosis. Normal
ophthalmic and posterior communicating artery origins. Normal
carotid termini, MCA and ACA origins. Anterior communicating artery
is diminutive or absent but there is a median artery of the corpus
callosum (normal variant). Visible ACA branches are within normal
limits. Left MCA M1, bifurcation, and left MCA branches are within
normal limits. Right MCA M1 segment, bifurcation, and right MCA
branches are within normal limits.
IMPRESSION: 1. Subtle asymmetric T2 signal and enhancement of the Left Facial
Nerve mastoid segment. Questionable asymmetric enhancement also of
the left geniculate ganglion.
But otherwise normal appearing left internal auditory structures,
normal left parotid space, and unremarkable face soft tissues.
This suggest inflammation of the nerve such as due to Viral
Neuritis.
2. Normal MRI appearance of the brain. No evidence of metastatic
disease.
3. Normal intracranial MRA.

## 2020-05-05 ENCOUNTER — Telehealth: Payer: Self-pay | Admitting: Family Medicine

## 2020-05-05 NOTE — Telephone Encounter (Signed)
Patient left a message for Dr Einar Pheasant. Patient is considering getting the covid vaccine but wanted make sure she can with the health issues.  Please advise.

## 2020-05-05 NOTE — Telephone Encounter (Signed)
Would recommend getting the covid vaccine even given her health issues.

## 2020-05-06 NOTE — Telephone Encounter (Signed)
Spoke to pt and let her know that Dr. Einar Pheasant does suggest that she get the covid vaccine. Pt says they will try to get a shot this weekend.

## 2020-05-10 ENCOUNTER — Other Ambulatory Visit: Payer: Self-pay

## 2020-05-10 ENCOUNTER — Ambulatory Visit (INDEPENDENT_AMBULATORY_CARE_PROVIDER_SITE_OTHER): Payer: BC Managed Care – PPO | Admitting: Family Medicine

## 2020-05-10 ENCOUNTER — Encounter: Payer: Self-pay | Admitting: Family Medicine

## 2020-05-10 VITALS — BP 124/82 | HR 71 | Temp 97.7°F | Ht 63.0 in | Wt 215.0 lb

## 2020-05-10 DIAGNOSIS — Z0000 Encounter for general adult medical examination without abnormal findings: Secondary | ICD-10-CM

## 2020-05-10 DIAGNOSIS — Z131 Encounter for screening for diabetes mellitus: Secondary | ICD-10-CM | POA: Diagnosis not present

## 2020-05-10 DIAGNOSIS — E6609 Other obesity due to excess calories: Secondary | ICD-10-CM | POA: Diagnosis not present

## 2020-05-10 DIAGNOSIS — Z13220 Encounter for screening for lipoid disorders: Secondary | ICD-10-CM | POA: Diagnosis not present

## 2020-05-10 DIAGNOSIS — M6208 Separation of muscle (nontraumatic), other site: Secondary | ICD-10-CM

## 2020-05-10 DIAGNOSIS — Z6838 Body mass index (BMI) 38.0-38.9, adult: Secondary | ICD-10-CM | POA: Diagnosis not present

## 2020-05-10 LAB — LIPID PANEL
Cholesterol: 174 mg/dL (ref 0–200)
HDL: 47 mg/dL (ref >39.00)
LDL Cholesterol: 107 mg/dL — ABNORMAL HIGH (ref 0–99)
NonHDL: 127.41
Total CHOL/HDL Ratio: 4
Triglycerides: 100 mg/dL (ref 0.0–149.0)
VLDL: 20 mg/dL (ref 0.0–40.0)

## 2020-05-10 LAB — COMPREHENSIVE METABOLIC PANEL
ALT: 12 U/L (ref 0–35)
AST: 17 U/L (ref 0–37)
Albumin: 3.9 g/dL (ref 3.5–5.2)
Alkaline Phosphatase: 73 U/L (ref 39–117)
BUN: 9 mg/dL (ref 6–23)
CO2: 28 mEq/L (ref 19–32)
Calcium: 9.3 mg/dL (ref 8.4–10.5)
Chloride: 106 mEq/L (ref 96–112)
Creatinine, Ser: 0.8 mg/dL (ref 0.40–1.20)
GFR: 94.64 mL/min (ref >60.00)
Glucose, Bld: 97 mg/dL (ref 70–99)
Potassium: 3.6 mEq/L (ref 3.5–5.1)
Sodium: 140 mEq/L (ref 135–145)
Total Bilirubin: 0.3 mg/dL (ref 0.2–1.2)
Total Protein: 6.8 g/dL (ref 6.0–8.3)

## 2020-05-10 LAB — HEMOGLOBIN A1C: Hgb A1c MFr Bld: 5.5 % (ref 4.6–6.5)

## 2020-05-10 NOTE — Progress Notes (Signed)
Annual Exam   Chief Complaint:  Chief Complaint  Patient presents with  . Annual Exam    History of Present Illness:  Ms. [name] is a 44 y.o. No obstetric history on file. who LMP was No LMP recorded., presents today for her annual examination.    GYN - Dr. Caralee Ates  Nutrition Diet: snacker, was on phentermine, will eat 1 or fewer meals per day Exercise: started walking with daughters She does get adequate calcium and Vitamin D in her diet.   Social History   Tobacco Use  Smoking Status Never Smoker  Smokeless Tobacco Never Used   Social History   Substance and Sexual Activity  Alcohol Use Yes   Comment: 1 to 2 glasses of wine every other night   Social History   Substance and Sexual Activity  Drug Use No    Safety The patient wears seatbelts: yes.     The patient feels safe at home and in their relationships: yes.  General Health Dentist in the last year: Yes Eye doctor: yes  Menstrual Her menses are regular every 28-30 days, lasting 3 day(s).   Dysmenorrhea none. She does not have intermenstrual bleeding.  GYN She is single partner, contraception - tubal ligation.   Cervical Cancer Screening:   Last Pap:  2021 Results were: no abnormalities /neg HPV DNA    Breast Cancer Screening There is no FH of breast cancer. There is no FH of ovarian cancer. BRCA screening Not Indicated.  Discussed that for average risk women between age 64-49 screening may reduce the risk of breast cancer death, however, at a lower rate than those over age 67. And that the the false-positive rates resulting in unnecessary biopsies with more screening is higher. The balance of benefits vs harms likely improves as you progress through your 40s. The patient does want a mammogram this year.   Weight Wt Readings from Last 3 Encounters:  05/10/20 215 lb (97.5 kg)  08/18/19 203 lb (92.1 kg)  10/29/18 190 lb (86.2 kg)   Patient has high BMI  BMI Readings from  Last 1 Encounters:  05/10/20 38.09 kg/m     Chronic disease screening Blood pressure monitoring:  BP Readings from Last 3 Encounters:  05/10/20 124/82  09/28/19 (!) 152/93  08/18/19 (!) 163/99    Lipid Monitoring: Indication for screening: age >90, obesity, diabetes, family hx, CV risk factors.  Lipid screening: Yes  No results found for: CHOL, HDL, LDLCALC, LDLDIRECT, TRIG, CHOLHDL   Diabetes Screening: age >83, overweight, family hx, PCOS, hx of gestational diabetes, at risk ethnicity Diabetes Screening screening: Yes  No results found for: HGBA1C   Past Medical History:  Diagnosis Date  . Bell's palsy   . Migraine   . Vitamin D deficiency     Past Surgical History:  Procedure Laterality Date  . CESAREAN SECTION    . Melanoma removed     right hand.  . TUBAL LIGATION    . WISDOM TOOTH EXTRACTION      Prior to Admission medications   Medication Sig Start Date End Date Taking? Authorizing Provider  BLACK ELDERBERRY,BERRY-FLOWER, PO Take by mouth.   Yes [provider]  cyanocobalamin 100 MCG tablet Take 100 mcg by mouth daily.   Yes [provider]  Multiple Vitamin (MULTIVITAMIN) tablet Take 1 tablet by mouth daily.   Yes [provider]  NON FORMULARY Take 1 tablet by mouth daily. CBD with ashwagandha   Yes [provider]  Zinc Sulfate (ZINC 15 PO) Take by mouth.   Yes [provider]  phentermine 37.5 MG capsule Take 37.5 mg by mouth every morning. Patient not taking: Reported on 05/10/2020    [provider]    No Known Allergies  Gynecologic History: No LMP recorded.  Obstetric History: No obstetric history on file.  Social History   Socioeconomic History  . Marital status: Married    Spouse name: Shawn  . Number of children: 3  . Years of education: Not on file  . Highest education level: Master's degree (e.g., MA, MS, MEng, MEd, MSW, MBA)  Occupational History  . Occupation: Associate Professor: [employer]  Tobacco Use  . Smoking status: Never Smoker  . Smokeless tobacco: Never Used  Vaping Use  . Vaping Use: Never used  Substance and Sexual Activity  . Alcohol use: Yes    Comment: 1 to 2 glasses of wine every other night  . Drug use: No  . Sexual activity: Yes    Birth control/protection: Surgical  Other Topics Concern  . Not on file  Social History Narrative   Lives with husband, 3 children and granddaughter in a 2 story home.     Education: Masters.       No longer working due to [employer] - staying at home with children      3 children - Tisha, [place], [place]   Social Determinants of Health   Financial Resource Strain:   . Difficulty of Paying Living Expenses:   Food Insecurity:   . Worried About Charity fundraiser in the Last Year:   . Arboriculturist in the Last Year:   Transportation Needs:   . Film/video editor (Medical):   Marland Kitchen Lack of Transportation (Non-Medical):   Physical Activity:   . Days of Exercise per Week:   . Minutes of Exercise per Session:   Stress:   . Feeling of Stress :   Social Connections:   . Frequency of Communication with Friends and Family:   . Frequency of Social Gatherings with Friends and Family:   . Attends Religious Services:   . Active Member of Clubs or Organizations:   . Attends Archivist Meetings:   Marland Kitchen Marital Status:   Intimate Partner Violence:   . Fear of Current or Ex-Partner:   . Emotionally Abused:   Marland Kitchen Physically Abused:   . Sexually Abused:     Family History  Problem Relation Age of Onset  . Hypertension Father   . Aneurysm Father   . Stroke Father 62  . Other Sister        low iron  . Stomach cancer Maternal Grandfather     Review of Systems  Constitutional: Negative for chills and fever.  HENT: Negative for congestion and sore throat.   Eyes: Negative for blurred vision and double vision.  Respiratory: Negative for shortness of breath.    Cardiovascular: Negative for chest pain.  Gastrointestinal: Negative for heartburn, nausea and vomiting.  Genitourinary: Negative.   Musculoskeletal: Negative.  Negative for myalgias.  Skin: Negative for rash.  Neurological: Negative for dizziness and headaches.  Endo/Heme/Allergies: Does not bruise/bleed easily.  Psychiatric/Behavioral: Negative for depression. The patient is not nervous/anxious.      Physical Exam BP 124/82   Pulse 71   Temp 97.7 F (36.5 C) (Temporal)   Ht '5\' 3"'$  (1.6 m)   Wt 215 lb (97.5 kg)  SpO2 99%   BMI 38.09 kg/m    BP Readings from Last 3 Encounters:  05/10/20 124/82  09/28/19 (!) 152/93  08/18/19 (!) 163/99      Physical Exam Constitutional:      General: She is not in acute distress.    Appearance: She is well-developed. She is not diaphoretic.  HENT:     Head: Normocephalic and atraumatic.     Right Ear: External ear normal.     Left Ear: External ear normal.     Nose: Nose normal.  Eyes:     General: No scleral icterus.    Conjunctiva/sclera: Conjunctivae normal.  Cardiovascular:     Rate and Rhythm: Normal rate and regular rhythm.     Heart sounds: No murmur heard.   Pulmonary:     Effort: Pulmonary effort is normal. No respiratory distress.     Breath sounds: Normal breath sounds. No wheezing.  Abdominal:     General: Bowel sounds are normal. There is no distension.     Palpations: Abdomen is soft. There is no mass.     Tenderness: There is no abdominal tenderness. There is no guarding or rebound.     Comments: Unable to palpate hernia but does seem to have small gap in abdominal wall muscles.   Musculoskeletal:        General: Normal range of motion.     Cervical back: Neck supple.  Lymphadenopathy:     Cervical: No cervical adenopathy.  Skin:    General: Skin is warm and dry.     Capillary Refill: Capillary refill takes less than 2 seconds.  Neurological:     Mental Status: She is alert and oriented to person, place,  and time.     Deep Tendon Reflexes: Reflexes normal.  Psychiatric:        Behavior: Behavior normal.      Results:  PHQ-9: low risk    Assessment: 44 y.o. No obstetric history on file. female here for routine annual physical examination.  Plan: Problem List Items Addressed This Visit    None    Visit Diagnoses    Annual physical exam    -  Primary   Class 2 obesity due to excess calories without serious comorbidity with body mass index (BMI) of 38.0 to 38.9 in adult       Relevant Orders   Lipid panel   Hemoglobin A1c   Comprehensive metabolic panel   Screening for hyperlipidemia       Relevant Orders   Lipid panel   Screening for diabetes mellitus       Relevant Orders   Hemoglobin A1c      Screening: -- Blood pressure screen normal -- cholesterol screening: will obtain -- Weight screening: obese: discussed management options, including lifestyle, dietary, and exercise. -- Diabetes Screening: will obtain -- Nutrition: Encouraged healthy diet  The ASCVD Risk score Mikey Bussing DC Jr., et al., 2013) failed to calculate for the following reasons:   Cannot find a previous HDL lab   Cannot find a previous total cholesterol lab  -- Statin therapy for Age 82-75 with CVD risk >7.5%  Psych -- Depression screening (PHQ-9):    Safety -- tobacco screening: not using -- alcohol screening:  low-risk usage. -- no evidence of domestic violence or intimate partner violence.   Cancer Screening -- pap smear not collected per ASCCP guidelines -- family history of breast cancer screening: done. not at high risk. -- Mammogram - requested -- Colon cancer (  age 48+)-- not indicated  Immunizations Immunization History  Administered Date(s) Administered  . Influenza, Seasonal, Injecte, Preservative Fre 07/02/2019  . PFIZER SARS-COV-2 Vaccination 05/07/2020    -- flu vaccine up to date -- TDAP q10 years up to date -- Covid-19 Vaccine up to date   Suspect muscle wall is  diastasis recti - pt will look up home exercise program and try doing PT if not improving  Encouraged healthy diet and exercise. Encouraged regular vision and dental care.   Lesleigh Noe, MD

## 2020-05-10 NOTE — Patient Instructions (Addendum)
[email or web address]  Diastasis Recti Exercises   Preventive Care 63-43 Years Old, Female Preventive care refers to visits with your health care provider and lifestyle choices that can promote health and wellness. This includes:  A yearly physical exam. This may also be called an annual well check.  Regular dental visits and eye exams.  Immunizations.  Screening for certain conditions.  Healthy lifestyle choices, such as eating a healthy diet, getting regular exercise, not using drugs or products that contain nicotine and tobacco, and limiting alcohol use. What can I expect for my preventive care visit? Physical exam Your health care provider will check your:  Height and weight. This may be used to calculate body mass index (BMI), which tells if you are at a healthy weight.  Heart rate and blood pressure.  Skin for abnormal spots. Counseling Your health care provider may ask you questions about your:  Alcohol, tobacco, and drug use.  Emotional well-being.  Home and relationship well-being.  Sexual activity.  Eating habits.  Work and work Statistician.  Method of birth control.  Menstrual cycle.  Pregnancy history. What immunizations do I need?  Influenza (flu) vaccine  This is recommended every year. Tetanus, diphtheria, and pertussis (Tdap) vaccine  You may need a Td booster every 10 years. Varicella (chickenpox) vaccine  You may need this if you have not been vaccinated. Zoster (shingles) vaccine  You may need this after age 80. Measles, mumps, and rubella (MMR) vaccine  You may need at least one dose of MMR if you were born in 1957 or later. You may also need a second dose. Pneumococcal conjugate (PCV13) vaccine  You may need this if you have certain conditions and were not previously vaccinated. Pneumococcal polysaccharide (PPSV23) vaccine  You may need one or two doses if you smoke cigarettes or if you  have certain conditions. Meningococcal conjugate (MenACWY) vaccine  You may need this if you have certain conditions. Hepatitis A vaccine  You may need this if you have certain conditions or if you travel or work in places where you may be exposed to hepatitis A. Hepatitis B vaccine  You may need this if you have certain conditions or if you travel or work in places where you may be exposed to hepatitis B. Haemophilus influenzae type b (Hib) vaccine  You may need this if you have certain conditions. Human papillomavirus (HPV) vaccine  If recommended by your health care provider, you may need three doses over 6 months. You may receive vaccines as individual doses or as more than one vaccine together in one shot (combination vaccines). Talk with your health care provider about the risks and benefits of combination vaccines. What tests do I need? Blood tests  Lipid and cholesterol levels. These may be checked every 5 years, or more frequently if you are over 27 years old.  Hepatitis C test.  Hepatitis B test. Screening  Lung cancer screening. You may have this screening every year starting at age 28 if you have a 30-pack-year history of smoking and currently smoke or have quit within the past 15 years.  Colorectal cancer screening. All adults should have this screening starting at age 67 and continuing until age 13. Your health care provider may recommend screening at age 80 if you are at increased risk. You will have tests every 1-10 years, depending on your results and the type of screening test.  Diabetes screening. This is done by checking your blood sugar (glucose) after you have not eaten for  a while (fasting). You may have this done every 1-3 years.  Mammogram. This may be done every 1-2 years. Talk with your health care provider about when you should start having regular mammograms. This may depend on whether you have a family history of breast cancer.  BRCA-related cancer  screening. This may be done if you have a family history of breast, ovarian, tubal, or peritoneal cancers.  Pelvic exam and Pap test. This may be done every 3 years starting at age 53. Starting at age 63, this may be done every 5 years if you have a Pap test in combination with an HPV test. Other tests  Sexually transmitted disease (STD) testing.  Bone density scan. This is done to screen for osteoporosis. You may have this scan if you are at high risk for osteoporosis. Follow these instructions at home: Eating and drinking  Eat a diet that includes fresh fruits and vegetables, whole grains, lean protein, and low-fat dairy.  Take vitamin and mineral supplements as recommended by your health care provider.  Do not drink alcohol if: ? Your health care provider tells you not to drink. ? You are pregnant, may be pregnant, or are planning to become pregnant.  If you drink alcohol: ? Limit how much you have to 0-1 drink a day. ? Be aware of how much alcohol is in your drink. In the [place] one drink equals one 12 oz bottle of beer (355 mL), one 5 oz glass of wine (148 mL), or one 1 oz glass of hard liquor (44 mL). Lifestyle  Take daily care of your teeth and gums.  Stay active. Exercise for at least 30 minutes on 5 or more days each week.  Do not use any products that contain nicotine or tobacco, such as cigarettes, e-cigarettes, and chewing tobacco. If you need help quitting, ask your health care provider.  If you are sexually active, practice safe sex. Use a condom or other form of birth control (contraception) in order to prevent pregnancy and STIs (sexually transmitted infections).  If told by your health care provider, take low-dose aspirin daily starting at age 33. What's next?  Visit your health care provider once a year for a well check visit.  Ask your health care provider how often you should have your eyes and teeth checked.  Stay up to date on all vaccines. This  information is not intended to replace advice given to you by your health care provider. Make sure you discuss any questions you have with your health care provider. Document Revised: 05/22/2018 Document Reviewed: 05/22/2018 Elsevier Patient Education  2020 [employer].

## 2020-05-11 ENCOUNTER — Encounter: Payer: Self-pay | Admitting: Family Medicine

## 2020-08-17 ENCOUNTER — Encounter: Payer: Self-pay | Admitting: Family Medicine

## 2020-08-17 ENCOUNTER — Other Ambulatory Visit: Payer: Self-pay

## 2020-08-17 ENCOUNTER — Ambulatory Visit (INDEPENDENT_AMBULATORY_CARE_PROVIDER_SITE_OTHER): Payer: BC Managed Care – PPO | Admitting: Family Medicine

## 2020-08-17 ENCOUNTER — Telehealth: Payer: Self-pay

## 2020-08-17 VITALS — BP 118/78 | HR 60 | Temp 98.1°F | Ht 63.0 in | Wt 221.8 lb

## 2020-08-17 DIAGNOSIS — G510 Bell's palsy: Secondary | ICD-10-CM | POA: Diagnosis not present

## 2020-08-17 DIAGNOSIS — M792 Neuralgia and neuritis, unspecified: Secondary | ICD-10-CM | POA: Diagnosis not present

## 2020-08-17 MED ORDER — METHYLPREDNISOLONE 4 MG PO TBPK: ORAL_TABLET | ORAL | 0 refills | Status: DC

## 2020-08-17 MED ORDER — PREGABALIN 75 MG PO CAPS: 75.0000 mg | ORAL_CAPSULE | Freq: Two times a day (BID) | ORAL | 3 refills | Status: DC

## 2020-08-17 NOTE — Progress Notes (Signed)
[name] T. Amillion Scobee, MD, [place] at [facility] [place], [address]  Phone: [phone]  FAX: [phone]  [name] - 44 y.o. female  MRN [id number]  Date of Birth: 10-Mar-1976  Date: 08/17/2020  PCP: Lesleigh Noe, MD  Referral: Lesleigh Noe, MD  Chief Complaint  Patient presents with  . Bells Palsy    This visit occurred during the SARS-CoV-2 public health emergency.  Safety protocols were in place, including screening questions prior to the visit, additional usage of staff PPE, and extensive cleaning of exam room while observing appropriate contact time as indicated for disinfecting solutions.   Subjective:   [name] is a 44 y.o. very pleasant female patient with Body mass index is 39.28 kg/m. who presents with the following:  H/o bell's palsy in the past: 2019  A lot of talking with bells palsy, and sometimes will get some severe pain.  In the middle of her conversation, was having some charlie horse type pain.   Slept on her left side a little bit.  Then started to notice the pain.  In and out of the bed since Monday.   Pain across the jawline and underher tneck and lateral.  She has had this multiple times before, and she has had Bell's palsy for 2 years with some times of remission in times of worsening.  Right now she is under some significant pain.  She has tried some gabapentin for her pain, but this is not really helped at all.  She was not able to tolerate significantly.  Review of Systems is noted in the HPI, as appropriate  Objective:   BP 118/78   Pulse 60   Temp 98.1 F (36.7 C) (Temporal)   Ht 5\' 3"  (1.6 m)   Wt 221 lb 12 oz (100.6 kg)   LMP 08/12/2020   SpO2 97%   BMI 39.28 kg/m   GEN: No acute distress; alert,appropriate. PULM: Breathing comfortably in no respiratory distress PSYCH: Normally interactive.   She has an obvious  Bell's palsy with facial droop and altered appearance, predominantly on the left side. Altered smile and ocular movements  Laboratory and Imaging Data:  Assessment and Plan:     ICD-10-CM   1. Bell's palsy  G51.0   2. Nerve pain  M79.2    Acute exacerbation of chronic Bell's palsy.  Medrol Dosepak.  In a setting of chronic nerve pain in the face after Bell's palsy, I am going to have the patient try some Lyrica, and I am optimistic that this will at least make some difference.  Certainly, she could titrate up the dose.  Meds ordered this encounter  Medications  . methylPREDNISolone (MEDROL DOSEPAK) 4 MG TBPK tablet    Sig: Follow directions on Dose Pak: Day 1: 2 tablets before breakfast, 1 tablet after lunch and after supper, 2 tablets at bedtime Day 2: 1 tablet before breakfast, 1 tablet after lunch and after supper, 2 tablets at bedtime Day 3: 1 tablet before breakfast and 1 tablet after lunch, after supper, and at bedtime Day 4: 1 tablet before breakfast, after lunch, and at bedtime Day 5: 1 tablet before breakfast and at bedtime Day 6: 1 tablet before breakfast.    Dispense:  21 tablet    Refill:  0    Dispense 1 Methylprednisolone Dose Pak  . pregabalin (LYRICA) 75 MG capsule  Sig: Take 1 capsule (75 mg total) by mouth 2 (two) times daily.    Dispense:  60 capsule    Refill:  3   Medications Discontinued During This Encounter  Medication Reason  . phentermine 37.5 MG capsule Completed Course   No orders of the defined types were placed in this encounter.   Follow-up: No follow-ups on file.  Signed,  Maud Deed. Saira Kramme, MD   Outpatient Encounter Medications as of 08/17/2020  Medication Sig  . BLACK ELDERBERRY,BERRY-FLOWER, PO Take by mouth.  . cyanocobalamin 100 MCG tablet Take 100 mcg by mouth daily.  . Multiple Vitamin (MULTIVITAMIN) tablet Take 1 tablet by mouth daily.  . NON FORMULARY Take 1 tablet by mouth daily. CBD with ashwagandha  . Zinc Sulfate (ZINC 15  PO) Take by mouth.  . methylPREDNISolone (MEDROL DOSEPAK) 4 MG TBPK tablet Follow directions on Dose Pak: Day 1: 2 tablets before breakfast, 1 tablet after lunch and after supper, 2 tablets at bedtime Day 2: 1 tablet before breakfast, 1 tablet after lunch and after supper, 2 tablets at bedtime Day 3: 1 tablet before breakfast and 1 tablet after lunch, after supper, and at bedtime Day 4: 1 tablet before breakfast, after lunch, and at bedtime Day 5: 1 tablet before breakfast and at bedtime Day 6: 1 tablet before breakfast.  . pregabalin (LYRICA) 75 MG capsule Take 1 capsule (75 mg total) by mouth 2 (two) times daily.  . [DISCONTINUED] phentermine 37.5 MG capsule Take 37.5 mg by mouth every morning. (Patient not taking: Reported on 05/10/2020)   No facility-administered encounter medications on file as of 08/17/2020.

## 2020-08-17 NOTE — Telephone Encounter (Signed)
[facility] - Client TELEPHONE ADVICE RECORD [facility] Patient Name: [name] Gender: Female DOB: 1975/10/17 Age: 44 Y 81 M 15 D Return Phone Number: [phone] (Primary) Address: City/State/Zip: [place] [address] Client [facility] - Client Client Site [place] Physician Waunita Schooner- MD Contact Type Call Who Is Calling Patient / Member / Family / Caregiver Call Type Triage / Clinical Relationship To Patient Self Return Phone Number [phone] (Primary) Chief Complaint Facial Pain Reason for Call Symptomatic / Request for Health Information Initial Comment Caller has Bells Palsy. She overworked her face and a nerve has been re damaged. She is in a lot of pain. Day 3 of pain. Can a steroid be called in. She is taking gabapentin and tylenol and nothing is easing the pain. Translation No Nurse Assessment Nurse: Wiser, Therapist, sports, Heidi Date/Time (Eastern Time): 08/16/2020 10:00:01 PM Confirm and document reason for call. If symptomatic, describe symptoms. ---Caller has Bells Palsy: Dx 2019. Caller has chronic pain along left jaw and under ear/down neck. 3 days ago pain increased to 12/10 constant. Caller has been taking Tylenol and Gabapentin without effect. However, tonight caller took generic Tylenol (Acetaminophen) and pain has decreased to 1/10. No other symptoms. Does the patient have any new or worsening symptoms? ---Yes Will a triage be completed? ---Yes Related visit to physician within the last 2 weeks? ---No Does the PT have any chronic conditions? (i.e. diabetes, asthma, this includes High risk factors for pregnancy, etc.) ---Yes List chronic conditions. ---see record Is the patient pregnant or possibly pregnant? (Ask all females between the ages of 79-55) ---No Is this a behavioral health or substance abuse call? ---No Guidelines Guideline Title Affirmed Question  Affirmed Notes Nurse Date/Time (Eastern Time) Face Pain Face pain present > 24 hours Wiser, RN, [facility] 08/16/2020 10:09:54 PM Disp. Time Eilene Ghazi Time) Disposition Final User PLEASE NOTE: All timestamps contained within this report are represented as [place] Standard Time. CONFIDENTIALTY NOTICE: This fax transmission is intended only for the addressee. It contains information that is legally privileged, confidential or otherwise protected from use or disclosure. If you are not the intended recipient, you are strictly prohibited from reviewing, disclosing, copying using or disseminating any of this information or taking any action in reliance on or regarding this information. If you have received this fax in error, please notify us immediately by telephone so that we can arrange for its return to [place]. Phone: [phone], Toll-Free: [phone], Fax: [phone] Page: 2 of 2 Call Id: [id number] 08/16/2020 10:12:15 PM See PCP within 24 Hours Yes Wiser, RN, Location manager Understands Yes PreDisposition InappropriateToAsk Care Advice Given Per Guideline SEE PCP WITHIN 24 HOURS: * IF OFFICE WILL BE OPEN: You need to be examined within the next 24 hours. Call your doctor (or NP/PA) when the office opens and make an appointment. PAIN MEDICINES: CALL BACK IF: * Any face rash or swelling * Pain becomes constant and severe * You become worse CARE ADVICE given per Face Pain (Adult) guideline. Referrals REFERRED TO PCP OFFICE

## 2020-08-17 NOTE — Telephone Encounter (Signed)
Pt already has appt 08/17/20 at 12 noon with Dr Lorelei Pont.

## 2020-08-29 ENCOUNTER — Telehealth: Payer: Self-pay | Admitting: Family Medicine

## 2020-08-29 NOTE — Telephone Encounter (Signed)
Patient came into office and dropped off FMLA paperwork. Please review, sign, and return when ready. Placed in [place].

## 2020-08-29 NOTE — Telephone Encounter (Signed)
Please clarify the extent of leave patient is needing. I saw she was treated with Dr. Lorelei Pont recently. Intermittent seems reasonable, but if she feels she is permanently disabled she needs an appointment

## 2020-08-31 NOTE — Telephone Encounter (Signed)
Called patient and inquired about permanent or intermittent. Pt stated permanent, as per previous appointments she will worsen and always have this. Pt asking permanent be placed on forms. Please advise as previous appointments with this issue or would you still like new appointment?

## 2020-09-01 NOTE — Telephone Encounter (Signed)
Called patient to discuss setting up office visit. Was speaking with patient and line disconnected. Attempted to call back but could not get through. Will attempt again later.

## 2020-09-01 NOTE — Telephone Encounter (Signed)
Please notify patient and Dr. Einar Pheasant is out of the office and will return her message Monday next week.

## 2020-09-01 NOTE — Telephone Encounter (Signed)
She will need an office visit. When I last saw her I wrote for 8 weeks for her to continue therapy and told her she needed a follow up for any additional time

## 2020-09-05 NOTE — Telephone Encounter (Signed)
Viewed patient has upcoming appointment with physician. Paperwork for FMLA left in [name]'s bottom file to wait for appointment.

## 2020-09-14 ENCOUNTER — Encounter: Payer: Self-pay | Admitting: Family Medicine

## 2020-09-14 ENCOUNTER — Other Ambulatory Visit: Payer: Self-pay

## 2020-09-14 ENCOUNTER — Ambulatory Visit: Payer: BC Managed Care – PPO | Admitting: Family Medicine

## 2020-09-14 VITALS — BP 122/80 | HR 74 | Temp 97.6°F | Ht 63.0 in | Wt 222.0 lb

## 2020-09-14 DIAGNOSIS — G510 Bell's palsy: Secondary | ICD-10-CM

## 2020-09-14 DIAGNOSIS — R42 Dizziness and giddiness: Secondary | ICD-10-CM

## 2020-09-14 NOTE — Assessment & Plan Note (Signed)
Persistent symptoms, though unfortunately I have not seen her in almost one year. Per neurology - no additional work-up. 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 Last seen in March 2021. Will readdress at next OV. She notes daily pain and recurrent flares of slurred speech which only resolve with voice rest. 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 We will do a trial of scheduled lyrica 75 mg as she noted benefit in pain to see if this reduces flares. She will also keep a journal to get a sense of triggers of slurred speech, timing, and frequency. Will also reach out to PMR colleagues to see if they feel they could offer benefit.

## 2020-09-14 NOTE — Progress Notes (Signed)
Subjective:     [name] is a 44 y.o. female presenting for medical questions (In reference to disability )     HPI  #Bells Palsy - Jan 2021 - FMLA until April 2021 - went to speech therapy  - decided to retire early  - in speech therapy - was told to just "slow down" -- after 30 minutes of a session she went from speaking clearly to dragging and having difficulty - last night had cramping pain on the left side of her face - she cannot predict flare ups and   Financial situation  - is now looking to get longterm disability benefits which are through the employer  Job limitations - Pain -- will feel her face lock up, and this happens more often, she currently just bares with this pain ---- daily ---- Treatment attempts: has tried tylenol and heating   - Has lyrica prescribed -- which she does not take daily and only at the onset of flares  - Speech -  - slurred speech First thing in the morning symptoms: at least once, can last 30-60 minutes and resolves with speech rest  Other episodes of slurred speech: occur at least 2 times per week  This week has had flares every day - but also endorses more stress - family members who have passed away  Response to lyrica  - Significant improvement in pain but does not resolve the pain - has not noticed if this makes a difference in speech - when she has a bell's flare she already gets blurred vision and dizziness 2/2 to bell's palsy  Last neurology visit was in April   Review of Systems   Social History   Tobacco Use  Smoking Status Never Smoker  Smokeless Tobacco Never Used        Objective:    BP Readings from Last 3 Encounters:  09/14/20 122/80  08/17/20 118/78  05/10/20 124/82   Wt Readings from Last 3 Encounters:  09/14/20 222 lb (100.7 kg)  08/17/20 221 lb 12 oz (100.6 kg)  05/10/20 215 lb (97.5 kg)    BP 122/80   Pulse 74   Temp 97.6 F (36.4 C) (Temporal)   Ht 5\' 3"  (1.6 m)   Wt 222 lb  (100.7 kg)   LMP 08/31/2020   SpO2 99%   BMI 39.33 kg/m    Physical Exam Constitutional:      General: She is not in acute distress.    Appearance: She is well-developed. She is not diaphoretic.  HENT:     Right Ear: External ear normal.     Left Ear: External ear normal.  Eyes:     Conjunctiva/sclera: Conjunctivae normal.  Cardiovascular:     Rate and Rhythm: Normal rate.  Pulmonary:     Effort: Pulmonary effort is normal.  Musculoskeletal:     Cervical back: Neck supple.  Skin:    General: Skin is warm and dry.     Capillary Refill: Capillary refill takes less than 2 seconds.  Neurological:     Mental Status: She is alert. Mental status is at baseline.     Comments: Left ptosis and droop. Normal speech pattern  Psychiatric:        Mood and Affect: Mood normal.        Behavior: Behavior normal.           Assessment & Plan:   Problem List Items Addressed This Visit      Nervous and  Auditory   Left-sided Bell's palsy    Persistent symptoms, though unfortunately I have not seen her in almost one year. Per neurology - no additional work-up. 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 Last seen in March 2021. Will readdress at next OV. She notes daily pain and recurrent flares of slurred speech which only resolve with voice rest. 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 We will do a trial of scheduled lyrica 75 mg as she noted benefit in pain to see if this reduces flares. She will also keep a journal to get a sense of triggers of slurred speech, timing, and frequency. Will also reach out to PMR colleagues to see if they feel they could offer benefit.         Other   Dizziness - Primary    She notes issues with equilbirum and dizziness since  bell's palsy. Referral to vestibular PT to see if they can offer some support.       Relevant Orders   Ambulatory referral to Physical Therapy       Return in about 4 weeks (around 10/12/2020).  Lesleigh Noe, MD  This visit occurred during the SARS-CoV-2 public health emergency.  Safety protocols were in place, including screening questions prior to the visit, additional usage of staff PPE, and extensive cleaning of exam room while observing appropriate contact time as indicated for disinfecting solutions.

## 2020-09-14 NOTE — Patient Instructions (Signed)
#   Bell's Palsey - Vestibular Physical therapy - referral - Start taking lyrica 75 mg twice daily  Keep a journal  1) Slurred speech -- time of day, how long it lasts, what made it better 2) Pain - severity, time of day, what made it better  Association with talking or eating -- either night before or within 2 hours of episode   -- Stress recently

## 2020-09-14 NOTE — Assessment & Plan Note (Signed)
She notes issues with equilbirum and dizziness since bell's palsy. Referral to vestibular PT to see if they can offer some support.

## 2020-09-14 NOTE — Telephone Encounter (Signed)
Received paperwork. Faxed over and patient notified.   Copy for scan Copy for patient

## 2020-09-26 ENCOUNTER — Telehealth: Payer: Self-pay

## 2020-09-26 NOTE — Telephone Encounter (Signed)
Agree with appt to evaluate.

## 2020-09-26 NOTE — Telephone Encounter (Signed)
Pt said last saw Dr Selena Batten on 09/14/20 for last 3 wks pt consistently taking lyrica 75 mg one capsule bid;now pt is haviing pain and soreness on rt side of face at jaw line; pt said had H/A on rt side but no H/A now; pt said rt eye feels grainy but is functioning normally. Pt said holding phone receiver to rt ear is hard due to ear being sore feeling. Pt was to FU with Dr Selena Batten in 4 wks from 09/14/20 but pt did not schedule yet. Scheduled in office appt with Dr Selena Batten on 09/27/20 at 4:20. UC & ED precautions given and pt voiced understanding. Sending note to Dr Selena Batten.

## 2020-09-27 ENCOUNTER — Ambulatory Visit (INDEPENDENT_AMBULATORY_CARE_PROVIDER_SITE_OTHER): Payer: Self-pay | Admitting: Family Medicine

## 2020-09-27 ENCOUNTER — Other Ambulatory Visit: Payer: Self-pay

## 2020-09-27 ENCOUNTER — Encounter: Payer: Self-pay | Admitting: Family Medicine

## 2020-09-27 VITALS — BP 136/82 | HR 80 | Temp 97.4°F | Wt 224.5 lb

## 2020-09-27 DIAGNOSIS — G510 Bell's palsy: Secondary | ICD-10-CM

## 2020-09-27 DIAGNOSIS — R519 Headache, unspecified: Secondary | ICD-10-CM

## 2020-09-27 NOTE — Patient Instructions (Signed)
#   Right side face pain - Glad it is getting better - try ibuprofen or voltaren gel  - if better in 2 days great! - if not better in 2 days ---- consider lyrica break for 1-2 days  If not better with stopping lyrica -- call next week and restart lyica   #Left side pain  - let me know if you want to try a higher dose dose of lyica

## 2020-09-27 NOTE — Progress Notes (Signed)
Subjective:     [name] is a 45 y.o. female presenting for Facial Pain (R side x 4 days )     HPI   #Right face pain - headache on the right side - eyes felt gritty - soreness of the touch by the ear - jaw line pain - pain with holding her phone  - endorses ear pain  - denies congestion  #Bell's  Started lyrica about 3 weeks ago Did notice that her left face pain was coming back Missed a dose and noticed the pain    Review of Systems   Social History   Tobacco Use  Smoking Status Never Smoker  Smokeless Tobacco Never Used        Objective:    BP Readings from Last 3 Encounters:  09/27/20 136/82  09/14/20 122/80  08/17/20 118/78   Wt Readings from Last 3 Encounters:  09/27/20 224 lb 8 oz (101.8 kg)  09/14/20 222 lb (100.7 kg)  08/17/20 221 lb 12 oz (100.6 kg)    BP 136/82   Pulse 80   Temp (!) 97.4 F (36.3 C) (Temporal)   Wt 224 lb 8 oz (101.8 kg)   LMP 08/31/2020   SpO2 100%   BMI 39.77 kg/m    Physical Exam Constitutional:      General: She is not in acute distress.    Appearance: She is well-developed. She is not diaphoretic.  HENT:     Head:     Jaw: Tenderness (anterior to the right ear and slightly along the mandible) present. No pain on movement.     Comments: Left bells palsy facial droop. No tmj ttp. Normal sensation on the right and motor movement.     Right Ear: Tympanic membrane, ear canal and external ear normal. There is no impacted cerumen.     Left Ear: Tympanic membrane, ear canal and external ear normal. There is no impacted cerumen.     Nose: Nose normal. No congestion or rhinorrhea.     Mouth/Throat:     Mouth: Mucous membranes are moist.     Pharynx: No oropharyngeal exudate or posterior oropharyngeal erythema.  Eyes:     General: No scleral icterus.    Extraocular Movements: Extraocular movements intact.     Conjunctiva/sclera: Conjunctivae normal.     Pupils: Pupils are equal, round, and reactive to  light.  Cardiovascular:     Rate and Rhythm: Normal rate and regular rhythm.     Heart sounds: No murmur heard.   Pulmonary:     Effort: Pulmonary effort is normal. No respiratory distress.     Breath sounds: Normal breath sounds. No wheezing.  Musculoskeletal:     Cervical back: Neck supple.  Skin:    General: Skin is warm and dry.     Capillary Refill: Capillary refill takes less than 2 seconds.  Neurological:     Mental Status: She is alert. Mental status is at baseline.  Psychiatric:        Mood and Affect: Mood normal.        Behavior: Behavior normal.           Assessment & Plan:   Problem List Items Addressed This Visit      Nervous and Auditory   Left-sided Bell's palsy - Primary    Significant improvement in pain 6-8/10 baseline to 3-4/10 on lyrica. Consider dose increase to see if pain could be reduced further - however - will wait to see if  right face pain is medication effect. She endorses some blurry vision but also gets this due to the palsy so unclear if that is related to lyrica. Consider eye exam.         Other   Right-sided face pain    Etiology unclear. No signs of TMJ. Slightly young for trigeminal neuralgia thought distribution consistent however, more achy constant pain so this seems less likely. No sinus symptoms or signs of otitis media. Could be fatigue/weakness due to more facial use over the holidays. She will watch and wait as improving. If not resolved in 2 days, trial off lyrica to see if medication side effect. Check in next week with update.           Return in about 4 weeks (around 10/25/2020).  Lesleigh Noe, MD  This visit occurred during the SARS-CoV-2 public health emergency.  Safety protocols were in place, including screening questions prior to the visit, additional usage of staff PPE, and extensive cleaning of exam room while observing appropriate contact time as indicated for disinfecting solutions.

## 2020-09-27 NOTE — Assessment & Plan Note (Signed)
Significant improvement in pain 6-8/10 baseline to 3-4/10 on lyrica. Consider dose increase to see if pain could be reduced further - however - will wait to see if right face pain is medication effect. She endorses some blurry vision but also gets this due to the palsy so unclear if that is related to lyrica. Consider eye exam.

## 2020-09-27 NOTE — Assessment & Plan Note (Signed)
Etiology unclear. No signs of TMJ. Slightly young for trigeminal neuralgia thought distribution consistent however, more achy constant pain so this seems less likely. No sinus symptoms or signs of otitis media. Could be fatigue/weakness due to more facial use over the holidays. She will watch and wait as improving. If not resolved in 2 days, trial off lyrica to see if medication side effect. Check in next week with update.

## 2020-10-05 ENCOUNTER — Encounter: Payer: Self-pay | Admitting: Family Medicine

## 2020-10-05 DIAGNOSIS — R519 Headache, unspecified: Secondary | ICD-10-CM

## 2020-10-18 ENCOUNTER — Other Ambulatory Visit (INDEPENDENT_AMBULATORY_CARE_PROVIDER_SITE_OTHER): Payer: Self-pay

## 2020-10-18 DIAGNOSIS — R519 Headache, unspecified: Secondary | ICD-10-CM

## 2020-10-18 LAB — CBC WITH DIFFERENTIAL/PLATELET
Basophils Absolute: 0.1 10*3/uL (ref 0.0–0.1)
Basophils Relative: 1.1 % (ref 0.0–3.0)
Eosinophils Absolute: 0.2 10*3/uL (ref 0.0–0.7)
Eosinophils Relative: 2.5 % (ref 0.0–5.0)
HCT: 35.5 % — ABNORMAL LOW (ref 36.0–46.0)
Hemoglobin: 12.1 g/dL (ref 12.0–15.0)
Lymphocytes Relative: 40.7 % (ref 12.0–46.0)
Lymphs Abs: 2.4 10*3/uL (ref 0.7–4.0)
MCHC: 34 g/dL (ref 30.0–36.0)
MCV: 91.1 fl (ref 78.0–100.0)
Monocytes Absolute: 0.4 10*3/uL (ref 0.1–1.0)
Monocytes Relative: 7 % (ref 3.0–12.0)
Neutro Abs: 2.9 10*3/uL (ref 1.4–7.7)
Neutrophils Relative %: 48.7 % (ref 43.0–77.0)
Platelets: 342 10*3/uL (ref 150.0–400.0)
RBC: 3.89 Mil/uL (ref 3.87–5.11)
RDW: 12.8 % (ref 11.5–15.5)
WBC: 6 10*3/uL (ref 4.0–10.5)

## 2020-10-18 LAB — COMPREHENSIVE METABOLIC PANEL
ALT: 9 U/L (ref 0–35)
AST: 14 U/L (ref 0–37)
Albumin: 4.2 g/dL (ref 3.5–5.2)
Alkaline Phosphatase: 70 U/L (ref 39–117)
BUN: 14 mg/dL (ref 6–23)
CO2: 26 mEq/L (ref 19–32)
Calcium: 9.7 mg/dL (ref 8.4–10.5)
Chloride: 107 mEq/L (ref 96–112)
Creatinine, Ser: 0.84 mg/dL (ref 0.40–1.20)
GFR: 84.99 mL/min (ref >60.00)
Glucose, Bld: 91 mg/dL (ref 70–99)
Potassium: 4 mEq/L (ref 3.5–5.1)
Sodium: 138 mEq/L (ref 135–145)
Total Bilirubin: 0.5 mg/dL (ref 0.2–1.2)
Total Protein: 7.1 g/dL (ref 6.0–8.3)

## 2020-10-18 LAB — SEDIMENTATION RATE: Sed Rate: 34 mm/hr — ABNORMAL HIGH (ref 0–20)

## 2020-10-18 LAB — HIGH SENSITIVITY CRP: CRP, High Sensitivity: 9.59 mg/L — ABNORMAL HIGH (ref 0.000–5.000)

## 2020-10-20 ENCOUNTER — Other Ambulatory Visit: Payer: Self-pay

## 2020-10-20 ENCOUNTER — Ambulatory Visit (INDEPENDENT_AMBULATORY_CARE_PROVIDER_SITE_OTHER): Payer: Self-pay | Admitting: Family Medicine

## 2020-10-20 ENCOUNTER — Encounter: Payer: Self-pay | Admitting: Family Medicine

## 2020-10-20 VITALS — BP 122/80 | HR 83 | Temp 98.2°F | Ht 63.0 in | Wt 221.5 lb

## 2020-10-20 DIAGNOSIS — G510 Bell's palsy: Secondary | ICD-10-CM

## 2020-10-20 DIAGNOSIS — R519 Headache, unspecified: Secondary | ICD-10-CM

## 2020-10-20 MED ORDER — PREDNISONE 20 MG PO TABS: ORAL_TABLET | ORAL | 0 refills | Status: AC

## 2020-10-20 NOTE — Assessment & Plan Note (Signed)
Etiology unclear. CRP and ESR are mildly elevated. Pain is overall improving but with TTP and pain with increased face movement or pressure. Discussed trial of steroids and neurology follow-up - she will call Dr. Doonquah's office and referral placed. Discussed could be trigeminal neuralagia but atypical symptoms and would like to have neurology opinion before expensive imaging. Reach out if worsening or no improvement 

## 2020-10-20 NOTE — Patient Instructions (Signed)
Right face pain - 2 weeks of steroids - call neurologists to schedule  Consider restarting lyrica - but can wait until steroid finish if you want

## 2020-10-20 NOTE — Progress Notes (Signed)
Subjective:     [name] is a 45 y.o. female presenting for Follow-up (For facial pain )     HPI   #Right sided face pain - has not had pain w/o palpation since 10/09/2020' - will still get pain if she is talking too much or if she leans on that side or touches the face - pain is till primarily right in the front of the ear  #left facial pain - has not restarted the lyrica - did start routine walking and healthy diet - still has face pain    Review of Systems   Social History   Tobacco Use  Smoking Status Never Smoker  Smokeless Tobacco Never Used        Objective:    BP Readings from Last 3 Encounters:  10/20/20 122/80  09/27/20 136/82  09/14/20 122/80   Wt Readings from Last 3 Encounters:  10/20/20 221 lb 8 oz (100.5 kg)  09/27/20 224 lb 8 oz (101.8 kg)  09/14/20 222 lb (100.7 kg)    BP 122/80   Pulse 83   Temp 98.2 F (36.8 C) (Temporal)   Ht 5' 3" (1.6 m)   Wt 221 lb 8 oz (100.5 kg)   LMP 09/28/2020 (Exact Date)   SpO2 100%   BMI 39.24 kg/m    Physical Exam Constitutional:      General: She is not in acute distress.    Appearance: She is well-developed. She is not diaphoretic.  HENT:     Head: Normocephalic and atraumatic.     Comments: TTP along the right zygomatic bone    Right Ear: External ear normal.     Left Ear: External ear normal.     Nose: Nose normal.  Eyes:     Conjunctiva/sclera: Conjunctivae normal.  Cardiovascular:     Rate and Rhythm: Normal rate.  Pulmonary:     Effort: Pulmonary effort is normal.  Musculoskeletal:     Cervical back: Neck supple.  Skin:    General: Skin is warm and dry.     Capillary Refill: Capillary refill takes less than 2 seconds.  Neurological:     Mental Status: She is alert. Mental status is at baseline.     Comments: Left side bell's palsy - facial droop Right side slightly diminish sensation V1-3 to light touch. Otherwise normal CN exam  Psychiatric:        Mood and Affect:  Mood normal.        Behavior: Behavior normal.    CRP 9.59 ESR 34     Assessment & Plan:   Problem List Items Addressed This Visit      Nervous and Auditory   Left-sided Bell's palsy    Discussed restarting lyrica once she finishes prednisone. Neurology referral        Other   Right-sided face pain - Primary    Etiology unclear. CRP and ESR are mildly elevated. Pain is overall improving but with TTP and pain with increased face movement or pressure. Discussed trial of steroids and neurology follow-up - she will call Dr. Freddie Apley office and referral placed. Discussed could be trigeminal neuralagia but atypical symptoms and would like to have neurology opinion before expensive imaging. Reach out if worsening or no improvement      Relevant Medications   predniSONE (DELTASONE) 20 MG tablet   Other Relevant Orders   Ambulatory referral to Neurology       Return if symptoms worsen or fail to improve.  Lesleigh Noe, MD  This visit occurred during the SARS-CoV-2 public health emergency.  Safety protocols were in place, including screening questions prior to the visit, additional usage of staff PPE, and extensive cleaning of exam room while observing appropriate contact time as indicated for disinfecting solutions.

## 2020-10-20 NOTE — Assessment & Plan Note (Signed)
Discussed restarting lyrica once she finishes prednisone. Neurology referral

## 2021-05-11 ENCOUNTER — Encounter: Payer: BC Managed Care – PPO | Admitting: Family Medicine

## 2021-06-13 ENCOUNTER — Telehealth (INDEPENDENT_AMBULATORY_CARE_PROVIDER_SITE_OTHER): Payer: Self-pay | Admitting: Family Medicine

## 2021-06-13 ENCOUNTER — Encounter: Payer: Self-pay | Admitting: Family Medicine

## 2021-06-13 ENCOUNTER — Other Ambulatory Visit: Payer: Self-pay

## 2021-06-13 DIAGNOSIS — U071 COVID-19: Secondary | ICD-10-CM | POA: Insufficient documentation

## 2021-06-13 MED ORDER — NIRMATRELVIR/RITONAVIR (PAXLOVID)TABLET: 3.0000 | ORAL_TABLET | Freq: Two times a day (BID) | ORAL | 0 refills | Status: AC

## 2021-06-13 NOTE — Assessment & Plan Note (Signed)
Reviewed currently approved EUA treatments.  Reviewed expected course of illness, anticipated course of recovery, as well as red flags to suggest COVID pneumonia and/or to seek urgent in-person care.  Reviewed latest CDC isolation/quarantine guidelines.  Encouraged fluids and rest. Reviewed further supportive care measures at home including vit C 500mg  bid, vit D 2000 IU daily, zinc 100mg  daily, tylenol PRN, pepcid 20mg  BID PRN.   Recommend:  Full dose Paxlovid WASP sent to pharmacy with indications when to fill however as overall she feels well, she will wait 24-48 hours prior to starting to see if continued improvement without antiviral need.  Paxlovid drug interactions:  None significant

## 2021-06-13 NOTE — Progress Notes (Signed)
Patient ID: [name], female    DOB: [birth year], 45 y.o.   MRN: [id number]  Virtual visit completed through [employer], a video enabled telemedicine application. Due to national recommendations of social distancing due to COVID-19, a virtual visit is felt to be most appropriate for this patient at this time. Reviewed limitations, risks, security and privacy concerns of performing a virtual visit and the availability of in person appointments. I also reviewed that there may be a patient responsible charge related to this service. The patient agreed to proceed.   Patient location: home Provider location: [place] at [facility], office Persons participating in this virtual visit: patient, provider   If any vitals were documented, they were collected by patient at home unless specified below.    BP 140/90   Pulse 80   Temp 97.7 F (36.5 C)   Ht 5\' 3"  (1.6 m)   Wt 214 lb (97.1 kg)   LMP 05/30/2021   BMI 37.91 kg/m   BP Readings from Last 3 Encounters:  06/13/21 140/90  10/20/20 122/80  09/27/20 136/82    CC: COVID infection Subjective:   HPI: [name] is a 45 y.o. female presenting on 06/13/2021 for Cough (C/o cough, runny nose and sore throat.  Sxs started 06/11/21.  Pos home COVID test today.  Has had 1st COVID booster 01/2021. )   First day of symptoms: 06/11/2021 Tested COVID positive: 06/13/2021  Current symptoms: cold symptoms, rhinorrhea, ST, initial HA now better  No: fevers/chills, body aches, abd pain, nausea, diarrhea, loss of taste or smell, dyspnea.  Treatments to date: moonshine OJ, as well as [facility] brand cold/flu.  Risk factors include: elevated blood pressure reading in the past, obesity BMI 37.   COVID vaccination status: Pfizer 04/2020, 05/2020, booster 01/2021   H/o Bells' Palsy.      Relevant past medical, surgical, family and social history reviewed and updated as indicated. Interim medical history since our last visit reviewed. Allergies and  medications reviewed and updated. Outpatient Medications Prior to Visit  Medication Sig Dispense Refill   ASHWAGANDHA PO Take 1 tablet by mouth daily.     B Complex-C (B-COMPLEX WITH VITAMIN C) tablet Take 1 tablet by mouth daily.     MISC NATURAL PRODUCTS PO Take by mouth daily. Turmeric     pregabalin (LYRICA) 75 MG capsule Take 1 capsule (75 mg total) by mouth 2 (two) times daily. 60 capsule 3   BLACK ELDERBERRY,BERRY-FLOWER, PO Take by mouth. (Patient not taking: Reported on 10/20/2020)     Zinc Sulfate (ZINC 15 PO) Take by mouth as needed. (Patient not taking: Reported on 10/20/2020)     No facility-administered medications prior to visit.     Per HPI unless specifically indicated in ROS section below Review of Systems Objective:  BP 140/90   Pulse 80   Temp 97.7 F (36.5 C)   Ht 5\' 3"  (1.6 m)   Wt 214 lb (97.1 kg)   LMP 05/30/2021   BMI 37.91 kg/m   Wt Readings from Last 3 Encounters:  06/13/21 214 lb (97.1 kg)  10/20/20 221 lb 8 oz (100.5 kg)  09/27/20 224 lb 8 oz (101.8 kg)       Physical exam: Gen: alert, NAD, tired appearing on couch Pulm: speaks in complete sentences without increased work of breathing Psych: normal mood, normal thought content      Lab Results  Component Value Date   CREATININE 0.84 10/18/2020   BUN 14 10/18/2020  NA 138 10/18/2020   K 4.0 10/18/2020   CL 107 10/18/2020   CO2 26 10/18/2020   GFR = 85 Assessment & Plan:   Problem List Items Addressed This Visit     COVID-19 virus infection    Reviewed currently approved EUA treatments.  Reviewed expected course of illness, anticipated course of recovery, as well as red flags to suggest COVID pneumonia and/or to seek urgent in-person care.  Reviewed latest CDC isolation/quarantine guidelines.  Encouraged fluids and rest. Reviewed further supportive care measures at home including vit C 500mg  bid, vit D 2000 IU daily, zinc 100mg  daily, tylenol PRN, pepcid 20mg  BID PRN.   Recommend:   Full dose Paxlovid WASP sent to pharmacy with indications when to fill however as overall she feels well, she will wait 24-48 hours prior to starting to see if continued improvement without antiviral need.  Paxlovid drug interactions:  None significant      Relevant Medications   nirmatrelvir/ritonavir EUA (PAXLOVID) 20 x 150 MG & 10 x 100MG  TABS     Meds ordered this encounter  Medications   nirmatrelvir/ritonavir EUA (PAXLOVID) 20 x 150 MG & 10 x 100MG  TABS    Sig: Take 3 tablets by mouth 2 (two) times daily for 5 days. (Take nirmatrelvir 150 mg two tablets twice daily for 5 days and ritonavir 100 mg one tablet twice daily for 5 days) Patient GFR is 85    Dispense:  30 tablet    Refill:  0   No orders of the defined types were placed in this encounter.   I discussed the assessment and treatment plan with the patient. The patient was provided an opportunity to ask questions and all were answered. The patient agreed with the plan and demonstrated an understanding of the instructions. The patient was advised to call back or seek an in-person evaluation if the symptoms worsen or if the condition fails to improve as anticipated.  Follow up plan: No follow-ups on file.  Ria Bush, MD

## 2022-08-28 ENCOUNTER — Encounter: Payer: Self-pay | Admitting: Nurse Practitioner

## 2022-08-28 ENCOUNTER — Ambulatory Visit (INDEPENDENT_AMBULATORY_CARE_PROVIDER_SITE_OTHER): Payer: BC Managed Care – PPO | Admitting: Nurse Practitioner

## 2022-08-28 ENCOUNTER — Encounter: Payer: Self-pay | Admitting: *Deleted

## 2022-08-28 VITALS — BP 136/88 | HR 68 | Temp 97.6°F | Ht 63.0 in | Wt 218.0 lb

## 2022-08-28 DIAGNOSIS — R519 Headache, unspecified: Secondary | ICD-10-CM

## 2022-08-28 DIAGNOSIS — G510 Bell's palsy: Secondary | ICD-10-CM | POA: Diagnosis not present

## 2022-08-28 DIAGNOSIS — Z1211 Encounter for screening for malignant neoplasm of colon: Secondary | ICD-10-CM

## 2022-08-28 DIAGNOSIS — Z0000 Encounter for general adult medical examination without abnormal findings: Secondary | ICD-10-CM | POA: Diagnosis not present

## 2022-08-28 DIAGNOSIS — R030 Elevated blood-pressure reading, without diagnosis of hypertension: Secondary | ICD-10-CM

## 2022-08-28 DIAGNOSIS — E669 Obesity, unspecified: Secondary | ICD-10-CM

## 2022-08-28 DIAGNOSIS — Z23 Encounter for immunization: Secondary | ICD-10-CM | POA: Diagnosis not present

## 2022-08-28 LAB — CBC
HCT: 32.6 % — ABNORMAL LOW (ref 36.0–46.0)
Hemoglobin: 11.2 g/dL — ABNORMAL LOW (ref 12.0–15.0)
MCHC: 34.3 g/dL (ref 30.0–36.0)
MCV: 90.4 fl (ref 78.0–100.0)
Platelets: 371 10*3/uL (ref 150.0–400.0)
RBC: 3.6 Mil/uL — ABNORMAL LOW (ref 3.87–5.11)
RDW: 12.9 % (ref 11.5–15.5)
WBC: 7.1 10*3/uL (ref 4.0–10.5)

## 2022-08-28 LAB — COMPREHENSIVE METABOLIC PANEL
ALT: 10 U/L (ref 0–35)
AST: 15 U/L (ref 0–37)
Albumin: 3.7 g/dL (ref 3.5–5.2)
Alkaline Phosphatase: 84 U/L (ref 39–117)
BUN: 10 mg/dL (ref 6–23)
CO2: 27 mEq/L (ref 19–32)
Calcium: 9.1 mg/dL (ref 8.4–10.5)
Chloride: 105 mEq/L (ref 96–112)
Creatinine, Ser: 0.91 mg/dL (ref 0.40–1.20)
GFR: 76.2 mL/min (ref >60.00)
Glucose, Bld: 88 mg/dL (ref 70–99)
Potassium: 4.3 mEq/L (ref 3.5–5.1)
Sodium: 138 mEq/L (ref 135–145)
Total Bilirubin: 0.5 mg/dL (ref 0.2–1.2)
Total Protein: 6.2 g/dL (ref 6.0–8.3)

## 2022-08-28 LAB — LIPID PANEL
Cholesterol: 185 mg/dL (ref 0–200)
HDL: 53.9 mg/dL (ref >39.00)
LDL Cholesterol: 118 mg/dL — ABNORMAL HIGH (ref 0–99)
NonHDL: 131.37
Total CHOL/HDL Ratio: 3
Triglycerides: 69 mg/dL (ref 0.0–149.0)
VLDL: 13.8 mg/dL (ref 0.0–40.0)

## 2022-08-28 LAB — TSH: TSH: 1.38 u[IU]/mL (ref 0.35–5.50)

## 2022-08-28 LAB — HEMOGLOBIN A1C: Hgb A1c MFr Bld: 5.6 % (ref 4.6–6.5)

## 2022-08-28 NOTE — Assessment & Plan Note (Signed)
Discussed age-appropriate immunizations and screening exams.  Patient's Pap and mammogram up-to-date.  Ambulatory referral for colonoscopy made today.  Tetanus shot updated in office.  Patient was given information at discharge in regards to healthcare maintenance with anticipatory guidance for age range.

## 2022-08-28 NOTE — Assessment & Plan Note (Signed)
Blood pressure came back down after recheck in office.

## 2022-08-28 NOTE — Assessment & Plan Note (Signed)
Historical diagnosis.  Patient has had flares.  Has been evaluated in neurology in the past states that clinic closed.  Ambulatory referral was made to [facility] neurology.

## 2022-08-28 NOTE — Assessment & Plan Note (Signed)
Has tried Lyrica in the past that did not help during flares per patient report.  Has been evaluated by neurology.  Ambulatory referral to neurology made today.

## 2022-08-28 NOTE — Assessment & Plan Note (Signed)
Encouraged healthy lifestyle modifications.

## 2022-08-28 NOTE — Patient Instructions (Signed)
Nice to see you today I will be in touch with the labs once I have them I will be in touch with the follow up once I have reviewed the labs

## 2022-08-28 NOTE — Progress Notes (Signed)
Established Patient Office Visit  Subjective   Patient ID: [name], female    DOB: [birth year]  Age: 46 y.o. MRN: [id number]  Chief Complaint  Patient presents with   Transitions Of Care    HPI  Right sided facial pain/ left sided bells palsy: History of bells palsy. States that she still has it. States that it was constant in the beginning. States that she had seen speech, neurology. States not longer seeing neurology. States that the practice closed.  Left sided vision disturbance from the palsy  Migrianes: state that are not as frequent since the nerve pain . Not even monthy.  States back of the head tha is a stabbing pain.  for complete physical and follow up of chronic conditions.  Immunizations: -Tetanus: Update today -Influenza: Refused -Shingles: too young -Pneumonia: too young  -HPV:  Diet: Fair diet. 3-6 meals a day. Some days few meals. States tthat she will drink when she eats. States not a lot of fluid throughout  Exercise: No regular exercise.   Eye exam: Completes annually. Glasses this year  Dental exam: Completes semi-annually   Pap Smear: Completed in Nov this year.  Patient is followed by GYN Dr. Irene Pap Mammogram: Completed through GYN  Colonoscopy: Completed in  amb refer due Lung Cancer Screening: Completed in  Dexa: Completed in  Sleep: States that she goes to bed around 12 midnight. Gets up around 6. Does feel rested. Does not snore        Review of Systems  Constitutional:  Negative for chills and fever.  Respiratory:  Negative for shortness of breath.   Cardiovascular:  Negative for chest pain.  Neurological:  Positive for tingling and headaches.      Objective:     BP 136/88 (BP Location: Left Arm)   Pulse 68   Temp 97.6 F (36.4 C) (Skin)   Ht '5\' 3"'$  (1.6 m)   Wt 218 lb (98.9 kg)   LMP 08/22/2022   SpO2 99%   BMI 38.62 kg/m  BP Readings from Last 3 Encounters:  08/28/22 136/88  06/13/21 140/90   10/20/20 122/80   Wt Readings from Last 3 Encounters:  08/28/22 218 lb (98.9 kg)  06/13/21 214 lb (97.1 kg)  10/20/20 221 lb 8 oz (100.5 kg)      Physical Exam Vitals and nursing note reviewed.  Constitutional:      Appearance: Normal appearance.  HENT:     Right Ear: Tympanic membrane, ear canal and external ear normal.     Left Ear: Tympanic membrane, ear canal and external ear normal.     Mouth/Throat:     Mouth: Mucous membranes are moist.     Pharynx: Oropharynx is clear.  Eyes:     Extraocular Movements: Extraocular movements intact.     Pupils: Pupils are equal, round, and reactive to light.  Cardiovascular:     Rate and Rhythm: Normal rate and regular rhythm.     Pulses: Normal pulses.     Heart sounds: Normal heart sounds.  Pulmonary:     Effort: Pulmonary effort is normal.     Breath sounds: Normal breath sounds.  Abdominal:     General: Bowel sounds are normal.  Musculoskeletal:     Right lower leg: No edema.     Left lower leg: No edema.  Lymphadenopathy:     Cervical: No cervical adenopathy.  Neurological:     Mental Status: She is alert.     Comments: Bilateral  upper and lower extremity strength 5/5  Left sided facial draw. Does not effect tongue or orophargnx      No results found for any visits on 08/28/22.    The 10-year ASCVD risk score (Arnett DK, et al., 2019) is: 1.7%    Assessment & Plan:   Problem List Items Addressed This Visit       Nervous and Auditory   Left-sided Bell's palsy    Historical diagnosis.  Patient has had flares.  Has been evaluated in neurology in the past states that clinic closed.  Ambulatory referral was made to [facility] neurology.      Relevant Orders   Ambulatory referral to Neurology     Other   Elevated blood pressure reading    Blood pressure came back down after recheck in office.      Right-sided face pain    Has tried Lyrica in the past that did not help during flares per patient report.  Has  been evaluated by neurology.  Ambulatory referral to neurology made today.      Relevant Orders   Ambulatory referral to Neurology   Obesity (BMI 30-39.9)    Encouraged healthy lifestyle modifications.      Relevant Orders   Hemoglobin A1c   TSH   Lipid panel   Preventative health care - Primary    Discussed age-appropriate immunizations and screening exams.  Patient's Pap and mammogram up-to-date.  Ambulatory referral for colonoscopy made today.  Tetanus shot updated in office.  Patient was given information at discharge in regards to healthcare maintenance with anticipatory guidance for age range.      Relevant Orders   CBC   Comprehensive metabolic panel   Hemoglobin A1c   TSH   Lipid panel   Other Visit Diagnoses     Need for tetanus, diphtheria, and acellular pertussis (Tdap) vaccine       Relevant Orders   Tdap vaccine greater than or equal to 7yo IM (Completed)   Screening for colon cancer       Relevant Orders   Ambulatory referral to Gastroenterology       Return for TBD labs.    Romilda Garret, NP

## 2022-08-30 ENCOUNTER — Encounter: Payer: Self-pay | Admitting: Nurse Practitioner

## 2022-08-30 DIAGNOSIS — E669 Obesity, unspecified: Secondary | ICD-10-CM

## 2022-08-30 MED ORDER — PHENTERMINE HCL 15 MG PO CAPS: 15.0000 mg | ORAL_CAPSULE | ORAL | 0 refills | Status: DC

## 2022-08-31 ENCOUNTER — Telehealth: Payer: Self-pay | Admitting: *Deleted

## 2022-08-31 ENCOUNTER — Other Ambulatory Visit: Payer: Self-pay | Admitting: *Deleted

## 2022-08-31 DIAGNOSIS — Z1211 Encounter for screening for malignant neoplasm of colon: Secondary | ICD-10-CM

## 2022-08-31 MED ORDER — NA SULFATE-K SULFATE-MG SULF 17.5-3.13-1.6 GM/177ML PO SOLN: 1.0000 | Freq: Once | ORAL | 0 refills | Status: AC

## 2022-08-31 NOTE — Telephone Encounter (Signed)
Gastroenterology Pre-Procedure Review  Request Date: 12/07/2022 Requesting Physician: Dr. Allen Norris  PATIENT REVIEW QUESTIONS: The patient responded to the following health history questions as indicated:    1. Are you having any GI issues? no 2. Do you have a personal history of Polyps? no 3. Do you have a family history of Colon Cancer or Polyps? no 4. Diabetes Mellitus? no 5. Joint replacements in the past 12 months?no 6. Major health problems in the past 3 months?no 7. Any artificial heart valves, MVP, or defibrillator?no    MEDICATIONS & ALLERGIES:    Patient reports the following regarding taking any anticoagulation/antiplatelet therapy:   Plavix, Coumadin, Eliquis, Xarelto, Lovenox, Pradaxa, Brilinta, or Effient? no Aspirin? no  Patient confirms/reports the following medications:  Current Outpatient Medications  Medication Sig Dispense Refill   phentermine 15 MG capsule Take 1 capsule (15 mg total) by mouth every morning. NEED OFFICE VISIT FOR REFILL 30 capsule 0   No current facility-administered medications for this visit.    Patient confirms/reports the following allergies:  No Known Allergies  No orders of the defined types were placed in this encounter.   AUTHORIZATION INFORMATION Primary Insurance: 1D#: Group #:  Secondary Insurance: 1D#: Group #:  SCHEDULE INFORMATION: Date: 12/07/2022 Time: Location: [facility]

## 2022-09-04 ENCOUNTER — Telehealth: Payer: Self-pay | Admitting: *Deleted

## 2022-09-04 NOTE — Telephone Encounter (Addendum)
Patient called office because she needs to needs to schedule her colonoscopy 12/07/2022 due to her children.  We have reschedule to 12/17/2022. I have called [facility] and spoken to Maudie Mercury to make the change. New instructions will be sent for patient.  Patient verbalized understanding.

## 2022-10-01 ENCOUNTER — Ambulatory Visit: Payer: BC Managed Care – PPO | Admitting: Nurse Practitioner

## 2022-10-01 ENCOUNTER — Encounter: Payer: Self-pay | Admitting: Nurse Practitioner

## 2022-10-01 VITALS — BP 128/88 | HR 65 | Wt 208.0 lb

## 2022-10-01 DIAGNOSIS — Z713 Dietary counseling and surveillance: Secondary | ICD-10-CM | POA: Diagnosis not present

## 2022-10-01 DIAGNOSIS — E669 Obesity, unspecified: Secondary | ICD-10-CM | POA: Diagnosis not present

## 2022-10-01 MED ORDER — PHENTERMINE HCL 15 MG PO CAPS: 15.0000 mg | ORAL_CAPSULE | ORAL | 0 refills | Status: DC

## 2022-10-01 NOTE — Assessment & Plan Note (Signed)
Patient currently maintained on phentermine 15 mg every morning.  Tolerated the medication well.  Has lost approximately 10 pounds.  Will continue for another month not to exceed 3 months.  Patient will follow-up in 1 month

## 2022-10-01 NOTE — Progress Notes (Signed)
   Established Patient Office Visit  Subjective   Patient ID: [name], female    DOB: 1976/09/07  Age: 47 y.o. MRN: [id number]  Chief Complaint  Patient presents with   Follow-up    HPI  Obesity: Patient was seen and preventative healthcare exam performed on 08/28/2022.  Patient was interested in some help with job starting her weight loss journey.  She had tried phentermine in the past with good result.  After getting labs returned we did start patient on 15 mg of phentermine every morning.  Patient is here for follow-up  States that she is taking it in the morning. States that she is toleratin the medication well. States that she will take it on an empty stomach andget some jitters  Diet: states that she is eating once a day with snakcing. States that the snacks are fruit or helathier options. Paying attention to her intake dietary intake. States that since last week she hsa cut out sugar and limited sodas. Limited bread/carb intake  Exercise: no structured exercise. States that he has been lifting and cleaning her house.     Review of Systems  Constitutional:  Negative for chills and fever.  Respiratory:  Negative for shortness of breath.   Cardiovascular:  Negative for chest pain and palpitations.  Neurological:  Negative for headaches.  Psychiatric/Behavioral:  The patient does not have insomnia.       Objective:     BP 128/88   Pulse 65   Wt 208 lb (94.3 kg)   SpO2 99%   BMI 36.85 kg/m  BP Readings from Last 3 Encounters:  10/01/22 128/88  08/28/22 136/88  06/13/21 140/90   Wt Readings from Last 3 Encounters:  10/01/22 208 lb (94.3 kg)  08/28/22 218 lb (98.9 kg)  06/13/21 214 lb (97.1 kg)      Physical Exam Vitals and nursing note reviewed.  Constitutional:      Appearance: Normal appearance.  Cardiovascular:     Rate and Rhythm: Normal rate and regular rhythm.     Heart sounds: Normal heart sounds.  Pulmonary:     Effort: Pulmonary effort is  normal.     Breath sounds: Normal breath sounds.  Neurological:     Mental Status: She is alert.      No results found for any visits on 10/01/22.  States that she does take it in the mornig    The 10-year ASCVD risk score (Arnett DK, et al., 2019) is: 1%    Assessment & Plan:   Problem List Items Addressed This Visit       Other   Obesity (BMI 30-39.9) - Primary    Patient is continue to work on healthy lifestyle modifications.  She is cut back and out some things in her diet.  Has been moving around more but no structured exercise.      Relevant Medications   phentermine 15 MG capsule   Encounter for weight loss counseling    Patient currently maintained on phentermine 15 mg every morning.  Tolerated the medication well.  Has lost approximately 10 pounds.  Will continue for another month not to exceed 3 months.  Patient will follow-up in 1 month       Return in about 4 weeks (around 10/29/2022) for Weight/medication recheck .    Romilda Garret, NP

## 2022-10-01 NOTE — Assessment & Plan Note (Signed)
Patient is continue to work on healthy lifestyle modifications.  She is cut back and out some things in her diet.  Has been moving around more but no structured exercise.

## 2022-10-01 NOTE — Patient Instructions (Signed)
Nice to see you today I have refilled the medication I want to see you in 1 month  Keep up the good work

## 2022-10-01 NOTE — Progress Notes (Signed)
   Established Patient Office Visit  Subjective   Patient ID: [name], female    DOB: Jul 11, 1976  Age: 47 y.o. MRN: [id number]  Chief Complaint  Patient presents with   Follow-up    HPI  Obesity: Patient was seen and preventative healthcare exam performed on 08/28/2022.  Patient was interested in some help with job starting her weight loss journey.  She had tried phentermine in the past with good result.  After getting labs returned we did start patient on 15 mg of phentermine every morning.  Patient is here for follow-up  States that she is taking it in the morning. States that she is toleratin the medication well. States that she will take it on an empty stomach andget some jitters  Diet: states that she is eating once a day with snakcing. States that the snacks are fruit or helathier options. Paying attention to her intake dietary intake. States that since last week she hsa cut out sugar and limited sodas. Limited bread/carb intake  Exercise: no structured exercise. States that he has been lifting and cleaning her house.     Review of Systems  Constitutional:  Negative for chills and fever.  Respiratory:  Negative for shortness of breath.   Cardiovascular:  Negative for chest pain and palpitations.  Neurological:  Negative for headaches.  Psychiatric/Behavioral:  The patient does not have insomnia.       Objective:     BP 128/88   Pulse 65   Wt 208 lb (94.3 kg)   SpO2 99%   BMI 36.85 kg/m  BP Readings from Last 3 Encounters:  10/01/22 128/88  08/28/22 136/88  06/13/21 140/90   Wt Readings from Last 3 Encounters:  10/01/22 208 lb (94.3 kg)  08/28/22 218 lb (98.9 kg)  06/13/21 214 lb (97.1 kg)      Physical Exam Vitals and nursing note reviewed.  Constitutional:      Appearance: Normal appearance.  Cardiovascular:     Rate and Rhythm: Normal rate and regular rhythm.     Heart sounds: Normal heart sounds.  Pulmonary:     Effort: Pulmonary effort is  normal.     Breath sounds: Normal breath sounds.  Neurological:     Mental Status: She is alert.      No results found for any visits on 10/01/22.  States that she does take it in the mornig    The 10-year ASCVD risk score (Arnett DK, et al., 2019) is: 1%    Assessment & Plan:   Problem List Items Addressed This Visit       Other   Obesity (BMI 30-39.9) - Primary   Relevant Medications   phentermine 15 MG capsule    Return in about 4 weeks (around 10/29/2022) for Weight/medication recheck .    Romilda Garret, NP

## 2022-10-02 ENCOUNTER — Other Ambulatory Visit: Payer: Self-pay | Admitting: Nurse Practitioner

## 2022-10-02 DIAGNOSIS — E669 Obesity, unspecified: Secondary | ICD-10-CM

## 2022-10-29 ENCOUNTER — Encounter: Payer: Self-pay | Admitting: Nurse Practitioner

## 2022-10-29 ENCOUNTER — Ambulatory Visit: Payer: BC Managed Care – PPO | Admitting: Nurse Practitioner

## 2022-10-29 DIAGNOSIS — E669 Obesity, unspecified: Secondary | ICD-10-CM | POA: Diagnosis not present

## 2022-10-29 MED ORDER — PHENTERMINE HCL 15 MG PO CAPS: 15.0000 mg | ORAL_CAPSULE | ORAL | 0 refills | Status: DC

## 2022-10-29 NOTE — Patient Instructions (Signed)
Nice to see you today We will do 1 more month of the medication You are doing well, keep up the good work Follow up with me in 10 months for your physical, sooner if you need me

## 2022-10-29 NOTE — Assessment & Plan Note (Signed)
Patient has been doing exercise.  She is working on her diet still has lost some weight since last office visit patient is down a total of 13 pounds in the past 2 months.  Congratulated patient on success encouraged her to keep going.  Patient seemed disappointed that she has not lost more weight I did go over with her the expectations a pound a week without exercise 2 pounds a week with exercise.  Last refill for phentermine 15 mg every morning sent in today.

## 2022-10-29 NOTE — Progress Notes (Signed)
Established Patient Office Visit  Subjective   Patient ID: [name], female    DOB: 06/20/1976  Age: 48 y.o. MRN: [id number]  Chief Complaint  Patient presents with   Weight Check    HPI  Weight management: Patient was seen by me on 08/28/2022.  Lab results turned back normal and we started patient on phentermine.  She did have a follow-up on 10/01/2022.  At that follow-up she is tolerating medication well.  States she was taking on empty stomach and would get some jitters.  States that she had been lifting and doing some cleaning around her house for exercise.  Patient has went from 218 pounds to 205 pounds today.  Blood pressure was within normal limits pulses within normal limits.  States that she has started exercising. She is getting her steps in. States that she is doing 30 min stepping with [employer]" exercise class. States 25-3500. Also will do some weights. States weights are 15-74mn. States that she will do each three times a week   States that she has been eating sometimes that she wants. States that she will do the sweets and carbs in moderation . States that she has cut back on some of the snacks. States that if she does snack she will have healthier options    Review of Systems  Constitutional:  Positive for chills. Negative for fever.  Respiratory:  Negative for shortness of breath.   Cardiovascular:  Negative for chest pain and palpitations.  Neurological:  Negative for headaches.  Psychiatric/Behavioral:  The patient does not have insomnia.       Objective:     BP 124/74   Pulse 60   Temp 98.2 F (36.8 C)   Resp 16   Ht '5\' 3"'$  (1.6 m)   Wt 205 lb (93 kg)   LMP 10/08/2022 (Approximate)   SpO2 98%   BMI 36.31 kg/m  BP Readings from Last 3 Encounters:  10/29/22 124/74  10/01/22 128/88  08/28/22 136/88   Wt Readings from Last 3 Encounters:  10/29/22 205 lb (93 kg)  10/01/22 208 lb (94.3 kg)  08/28/22 218 lb (98.9 kg)      Physical Exam Vitals and  nursing note reviewed.  Constitutional:      Appearance: Normal appearance.  Cardiovascular:     Rate and Rhythm: Normal rate and regular rhythm.     Heart sounds: Normal heart sounds.  Pulmonary:     Effort: Pulmonary effort is normal.     Breath sounds: Normal breath sounds.  Neurological:     Mental Status: She is alert.      No results found for any visits on 10/29/22.    The 10-year ASCVD risk score (Arnett DK, et al., 2019) is: 0.9%    Assessment & Plan:   Problem List Items Addressed This Visit       Other   Obesity (BMI 30-39.9)    Patient has been doing exercise.  She is working on her diet still has lost some weight since last office visit patient is down a total of 13 pounds in the past 2 months.  Congratulated patient on success encouraged her to keep going.  Patient seemed disappointed that she has not lost more weight I did go over with her the expectations a pound a week without exercise 2 pounds a week with exercise.  Last refill for phentermine 15 mg every morning sent in today.      Relevant Medications   phentermine  15 MG capsule    Return in about 10 months (around 08/29/2023) for CPE and Labs.    Romilda Garret, NP

## 2022-11-20 ENCOUNTER — Telehealth: Payer: Self-pay | Admitting: Gastroenterology

## 2022-11-20 NOTE — Telephone Encounter (Signed)
Pt would like to change date of procedure and would like call back- [phone]

## 2022-11-20 NOTE — Telephone Encounter (Signed)
Patient called [facility] and make the change with Maudie Mercury. Patient have been reschedule to 03/25/2023

## 2023-03-18 ENCOUNTER — Encounter: Payer: Self-pay | Admitting: Gastroenterology

## 2023-03-18 NOTE — Anesthesia Preprocedure Evaluation (Addendum)
Anesthesia Evaluation  Patient identified by MRN, date of birth, ID band Patient awake    Reviewed: Allergy & Precautions, H&P , NPO status , Patient's Chart, lab work & pertinent test results  Airway Mallampati: II  TM Distance: >3 FB Neck ROM: Full    Dental no notable dental hx.    Pulmonary neg pulmonary ROS   Pulmonary exam normal breath sounds clear to auscultation       Cardiovascular negative cardio ROS Normal cardiovascular exam+ Valvular Problems/Murmurs  Rhythm:Regular Rate:Normal  Hx heart murmur but can't find echo in Epic   Neuro/Psych  Headaches  Neuromuscular disease negative neurological ROS  negative psych ROS   GI/Hepatic negative GI ROS, Neg liver ROS,,,  Endo/Other  negative endocrine ROS    Renal/GU negative Renal ROS  negative genitourinary   Musculoskeletal negative musculoskeletal ROS (+)    Abdominal   Peds negative pediatric ROS (+)  Hematology negative hematology ROS (+)   Anesthesia Other Findings Migraine  Bell's palsy facial paralysis left side since 2019 Vitamin D deficiency  Heart murmur Obesity    Reproductive/Obstetrics negative OB ROS                             Anesthesia Physical Anesthesia Plan  ASA: 3  Anesthesia Plan: General   Post-op Pain Management:    Induction: Intravenous  PONV Risk Score and Plan:   Airway Management Planned: Natural Airway and Nasal Cannula  Additional Equipment:   Intra-op Plan:   Post-operative Plan:   Informed Consent: I have reviewed the patients History and Physical, chart, labs and discussed the procedure including the risks, benefits and alternatives for the proposed anesthesia with the patient or authorized representative who has indicated his/her understanding and acceptance.     Dental Advisory Given  Plan Discussed with: Anesthesiologist, CRNA and Surgeon  Anesthesia Plan Comments:  (Patient consented for risks of anesthesia including but not limited to:  - adverse reactions to medications - risk of airway placement if required - damage to eyes, teeth, lips or other oral mucosa - nerve damage due to positioning  - sore throat or hoarseness - Damage to heart, brain, nerves, lungs, other parts of body or loss of life  Patient voiced understanding.)       Anesthesia Quick Evaluation

## 2023-03-25 ENCOUNTER — Encounter: Payer: Self-pay | Admitting: Gastroenterology

## 2023-03-25 ENCOUNTER — Ambulatory Visit
Admission: RE | Admit: 2023-03-25 | Discharge: 2023-03-25 | Disposition: A | Discharge status: Home / Self Care | Payer: BC Managed Care – PPO | Attending: Gastroenterology | Admitting: Gastroenterology

## 2023-03-25 ENCOUNTER — Encounter
Admission: RE | Disposition: A | Discharge status: Home / Self Care | Payer: Self-pay | Source: Home / Self Care | Attending: Gastroenterology

## 2023-03-25 ENCOUNTER — Ambulatory Visit: Payer: BC Managed Care – PPO | Admitting: Anesthesiology

## 2023-03-25 ENCOUNTER — Other Ambulatory Visit: Payer: Self-pay

## 2023-03-25 DIAGNOSIS — Z6834 Body mass index (BMI) 34.0-34.9, adult: Secondary | ICD-10-CM | POA: Insufficient documentation

## 2023-03-25 DIAGNOSIS — K640 First degree hemorrhoids: Secondary | ICD-10-CM | POA: Insufficient documentation

## 2023-03-25 DIAGNOSIS — E669 Obesity, unspecified: Secondary | ICD-10-CM | POA: Insufficient documentation

## 2023-03-25 DIAGNOSIS — Z1211 Encounter for screening for malignant neoplasm of colon: Secondary | ICD-10-CM

## 2023-03-25 HISTORY — DX: Cardiac murmur, unspecified: R01.1

## 2023-03-25 HISTORY — PX: COLONOSCOPY WITH PROPOFOL: SHX5780

## 2023-03-25 LAB — POCT PREGNANCY, URINE: Preg Test, Ur: NEGATIVE

## 2023-03-25 SURGERY — COLONOSCOPY WITH PROPOFOL
Anesthesia: General | Site: Rectum

## 2023-03-25 MED ORDER — SODIUM CHLORIDE 0.9 % IV SOLN: INTRAVENOUS | Status: DC

## 2023-03-25 MED ORDER — LIDOCAINE HCL (CARDIAC) PF 100 MG/5ML IV SOSY
PREFILLED_SYRINGE | INTRAVENOUS | Status: DC | PRN
  Administered 2023-03-25: 60 mg via INTRAVENOUS

## 2023-03-25 MED ORDER — PROPOFOL 10 MG/ML IV BOLUS
INTRAVENOUS | Status: DC | PRN
  Administered 2023-03-25: 100 mg via INTRAVENOUS
  Administered 2023-03-25: 40 mg via INTRAVENOUS
  Administered 2023-03-25 (×2): 30 mg via INTRAVENOUS

## 2023-03-25 MED ORDER — PROPOFOL 10 MG/ML IV BOLUS: INTRAVENOUS | Status: DC | PRN

## 2023-03-25 MED ORDER — LIDOCAINE HCL (CARDIAC) PF 100 MG/5ML IV SOSY: PREFILLED_SYRINGE | INTRAVENOUS | Status: DC | PRN

## 2023-03-25 MED ORDER — LACTATED RINGERS IV SOLN: INTRAVENOUS | Status: DC

## 2023-03-25 MED ORDER — STERILE WATER FOR IRRIGATION IR SOLN
Status: DC | PRN
  Administered 2023-03-25: 50 mL

## 2023-03-25 SURGICAL SUPPLY — 21 items
CLIP HMST 235XBRD CATH ROT (MISCELLANEOUS) IMPLANT
CLIP RESOLUTION 360 11X235 (MISCELLANEOUS)
ELECT REM PT RETURN 9FT ADLT (ELECTROSURGICAL)
ELECTRODE REM PT RTRN 9FT ADLT (ELECTROSURGICAL) IMPLANT
FORCEPS BIOP RAD 4 LRG CAP 4 (CUTTING FORCEPS) IMPLANT
GOWN CVR UNV OPN BCK APRN NK (MISCELLANEOUS) ×4 IMPLANT
GOWN ISOL THUMB LOOP REG UNIV (MISCELLANEOUS) ×2
INJECTOR VARIJECT VIN23 (MISCELLANEOUS) IMPLANT
KIT DEFENDO VALVE AND CONN (KITS) IMPLANT
KIT PRC NS LF DISP ENDO (KITS) ×2 IMPLANT
KIT PROCEDURE OLYMPUS (KITS) ×1
MANIFOLD NEPTUNE II (INSTRUMENTS) ×2 IMPLANT
MARKER SPOT ENDO TATTOO 5ML (MISCELLANEOUS) IMPLANT
PROBE APC STR FIRE (PROBE) IMPLANT
RETRIEVER NET ROTH 2.5X230 LF (MISCELLANEOUS) IMPLANT
SNARE COLD EXACTO (MISCELLANEOUS) IMPLANT
SNARE SHORT THROW 13M SML OVAL (MISCELLANEOUS) IMPLANT
SNARE SNG USE RND 15MM (INSTRUMENTS) IMPLANT
TRAP ETRAP POLY (MISCELLANEOUS) IMPLANT
VARIJECT INJECTOR VIN23 (MISCELLANEOUS)
WATER STERILE IRR 250ML POUR (IV SOLUTION) ×2 IMPLANT

## 2023-03-25 NOTE — Transfer of Care (Signed)
Immediate Anesthesia Transfer of Care Note  Patient: [name]  Procedure(s) Performed: COLONOSCOPY WITH PROPOFOL (Rectum)  Patient Location: PACU  Anesthesia Type: General  Level of Consciousness: awake, alert  and patient cooperative  Airway and Oxygen Therapy: Patient Spontanous Breathing and Patient connected to supplemental oxygen  Post-op Assessment: Post-op Vital signs reviewed, Patient's Cardiovascular Status Stable, Respiratory Function Stable, Patent Airway and No signs of Nausea or vomiting  Post-op Vital Signs: Reviewed and stable  Complications: No notable events documented.

## 2023-03-25 NOTE — H&P (Signed)
Midge Minium, MD [facility] [address], [address] [place], [place] [address] Phone: [phone] Fax : [phone]  Primary Care Physician:  Eden Emms, NP Primary Gastroenterologist:  Dr. Servando Snare  Pre-Procedure History & Physical: HPI:  [name] is a 47 y.o. female is here for a screening colonoscopy.   Past Medical History:  Diagnosis Date   Bell's palsy    Heart murmur    Migraine    Vitamin D deficiency     Past Surgical History:  Procedure Laterality Date   CESAREAN SECTION     Melanoma removed     right hand.   TUBAL LIGATION     WISDOM TOOTH EXTRACTION      Prior to Admission medications   Medication Sig Start Date End Date Taking? Authorizing Provider  Biotin 3 MG TABS Take by mouth.   Yes [provider]  nortriptyline (PAMELOR) 10 MG capsule Take 10 mg by mouth as needed for sleep.   Yes [provider]  Omega-3 Fatty Acids (OMEGA-3 CF PO) Take by mouth.   Yes [provider]  phentermine 15 MG capsule Take 1 capsule (15 mg total) by mouth every morning. 10/29/22  Yes Eden Emms, NP    Allergies as of 08/31/2022   (No Known Allergies)    Family History  Problem Relation Age of Onset   Hypertension Father    Aneurysm Father        cerbreal   Stroke Father 20   Other Sister        low iron   Stomach cancer Maternal Grandfather     Social History   Socioeconomic History   Marital status: Married    Spouse name: Shawn   Number of children: 3   Years of education: Not on file   Highest education level: Master's degree (e.g., MA, MS, MEng, MEd, MSW, MBA)  Occupational History   Occupation: Magazine features editor: [employer]  Tobacco Use   Smoking status: Never   Smokeless tobacco: Never  Vaping Use   Vaping Use: Never used  Substance and Sexual Activity   Alcohol use: Yes    Comment: 1 glass every other month   Drug use: No   Sexual activity: Yes    Birth control/protection: Surgical  Other  Topics Concern   Not on file  Social History Narrative   Lives with husband, 3 children and granddaughter in a 2 story home.     Education: Masters.       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 Neck:  Supple; no masses or thyromegaly. Lungs:  Clear  throughout to auscultation.    Heart:  Regular rate and rhythm. Abdomen:  Soft, nontender and nondistended. Normal bowel sounds, without guarding, and without rebound.   Neurologic:  Alert and  oriented x4;  grossly normal neurologically.  Impression/Plan: [name] is now here to undergo a screening colonoscopy.  Risks, benefits, and alternatives regarding colonoscopy have been reviewed with the patient.  Questions have been answered.  All parties agreeable.

## 2023-03-25 NOTE — Addendum Note (Signed)
Addendum  created 03/25/23 1042 by Barbette Hair, CRNA   Intraprocedure Staff edited

## 2023-03-25 NOTE — Anesthesia Postprocedure Evaluation (Signed)
Anesthesia Post Note  Patient: [name]  Procedure(s) Performed: COLONOSCOPY WITH PROPOFOL (Rectum)  Patient location during evaluation: PACU Anesthesia Type: General Level of consciousness: awake and alert Pain management: pain level controlled Vital Signs Assessment: post-procedure vital signs reviewed and stable Respiratory status: spontaneous breathing, nonlabored ventilation, respiratory function stable and patient connected to nasal cannula oxygen Cardiovascular status: blood pressure returned to baseline and stable Postop Assessment: no apparent nausea or vomiting Anesthetic complications: no   No notable events documented.   Last Vitals:  Vitals:   03/25/23 0810 03/25/23 0815  BP:  110/73  Pulse: 69 85  Resp: 18 16  Temp:  36.7 C  SpO2: 100% 100%    Last Pain:  Vitals:   03/25/23 0815  TempSrc:   PainSc: 0-No pain                 Brendan Gruwell C Teah Votaw

## 2023-03-25 NOTE — Op Note (Signed)
[facility] Gastroenterology Patient Name: [name] Procedure Date: 03/25/2023 7:37 AM MRN: [id number] Account #: [id number] Date of Birth: 10-May-1976 Admit Type: Outpatient Age: 47 Room: [facility] OR ROOM 01 Gender: Female Note Status: Finalized Instrument Name: [id number] Procedure:             Colonoscopy Indications:           Screening for colorectal malignant neoplasm Providers:             Midge Minium MD, MD Referring MD:          Genene Churn. Cable (Referring MD) Medicines:             Propofol per Anesthesia Complications:         No immediate complications. Procedure:             Pre-Anesthesia Assessment:                        - Prior to the procedure, a History and Physical was                         performed, and patient medications and allergies were                         reviewed. The patient's tolerance of previous                         anesthesia was also reviewed. The risks and benefits                         of the procedure and the sedation options and risks                         were discussed with the patient. All questions were                         answered, and informed consent was obtained. Prior                         Anticoagulants: The patient has taken no anticoagulant                         or antiplatelet agents. ASA Grade Assessment: II - A                         patient with mild systemic disease. After reviewing                         the risks and benefits, the patient was deemed in                         satisfactory condition to undergo the procedure.                        After obtaining informed consent, the colonoscope was                         passed under direct vision. Throughout the procedure,  the patient's blood pressure, pulse, and oxygen                         saturations were monitored continuously. The                         Colonoscope was introduced through the anus and                          advanced to the the cecum, identified by appendiceal                         orifice and ileocecal valve. The colonoscopy was                         performed without difficulty. The patient tolerated                         the procedure well. The quality of the bowel                         preparation was excellent. Findings:      The perianal and digital rectal examinations were normal.      Non-bleeding internal hemorrhoids were found during retroflexion. The       hemorrhoids were Grade I (internal hemorrhoids that do not prolapse).      The exam was otherwise without abnormality. Impression:            - Non-bleeding internal hemorrhoids.                        - The examination was otherwise normal.                        - No specimens collected. Recommendation:        - Discharge patient to home.                        - Resume previous diet.                        - Continue present medications.                        - Repeat colonoscopy in 10 years for screening                         purposes. Procedure Code(s):     --- Professional ---                        [phone], Colonoscopy, flexible; diagnostic, including                         collection of specimen(s) by brushing or washing, when                         performed (separate procedure) Diagnosis Code(s):     --- Professional ---                        Z12.11, Encounter for  screening for malignant neoplasm                         of colon CPT copyright 2022 American Medical Association. All rights reserved. The codes documented in this report are preliminary and upon coder review may  be revised to meet current compliance requirements. Midge Minium MD, MD 03/25/2023 8:02:25 AM This report has been signed electronically. Number of Addenda: 0 Note Initiated On: 03/25/2023 7:37 AM Scope Withdrawal Time: 0 hours 7 minutes 6 seconds  Total Procedure Duration: 0 hours 10 minutes 50 seconds  Estimated  Blood Loss:  Estimated blood loss: none.      [facility]

## 2023-03-26 ENCOUNTER — Encounter: Payer: Self-pay | Admitting: Gastroenterology

## 2024-07-03 DIAGNOSIS — Z1231 Encounter for screening mammogram for malignant neoplasm of breast: Secondary | ICD-10-CM | POA: Diagnosis not present

## 2024-07-03 DIAGNOSIS — Z01419 Encounter for gynecological examination (general) (routine) without abnormal findings: Secondary | ICD-10-CM | POA: Diagnosis not present

## 2024-07-10 ENCOUNTER — Telehealth: Payer: Self-pay

## 2024-07-10 DIAGNOSIS — Z124 Encounter for screening for malignant neoplasm of cervix: Secondary | ICD-10-CM | POA: Diagnosis not present

## 2024-07-10 NOTE — Telephone Encounter (Signed)
 Called pt because she is on Dr Charlyn schedule 07-13-24 for CPE. She is a Economist pt. I asked her if she was planning on doing a TOC to Dr Bennett or was she just wanting a CPE. She said when she made the appt 06-23-24, she was told it did not matter who she saw for her CPE since Adina was scheduling out to March 2026 for CPE. I explained that CPEs need to be done by her PCP. She said she would like to keep the appt with Dr Bennett and transfer to her as she would like to see a woman. Will keep her appt with Dr Bennett on Monday and make it a TOC.   I will forward this to management to follow-up on the agent advising her it did not matter who she saw for her CPE.

## 2024-07-13 ENCOUNTER — Ambulatory Visit: Payer: Self-pay

## 2024-07-13 VITALS — BP 136/86 | HR 60 | Temp 97.7°F | Ht 62.25 in | Wt 221.0 lb

## 2024-07-13 DIAGNOSIS — C4360 Malignant melanoma of unspecified upper limb, including shoulder: Secondary | ICD-10-CM

## 2024-07-13 DIAGNOSIS — Z23 Encounter for immunization: Secondary | ICD-10-CM

## 2024-07-13 DIAGNOSIS — Z131 Encounter for screening for diabetes mellitus: Secondary | ICD-10-CM

## 2024-07-13 DIAGNOSIS — Z136 Encounter for screening for cardiovascular disorders: Secondary | ICD-10-CM | POA: Diagnosis not present

## 2024-07-13 DIAGNOSIS — Z0000 Encounter for general adult medical examination without abnormal findings: Secondary | ICD-10-CM | POA: Diagnosis not present

## 2024-07-13 DIAGNOSIS — R030 Elevated blood-pressure reading, without diagnosis of hypertension: Secondary | ICD-10-CM

## 2024-07-13 DIAGNOSIS — Z113 Encounter for screening for infections with a predominantly sexual mode of transmission: Secondary | ICD-10-CM

## 2024-07-13 MED ORDER — PHENTERMINE HCL 15 MG PO CAPS
15.0000 mg | ORAL_CAPSULE | ORAL | 0 refills | Status: AC
Start: 2024-07-13 — End: 2024-08-12

## 2024-07-13 NOTE — Progress Notes (Signed)
 .  Assessment & Plan:   This visit was conducted in person. The patient gave informed consent to the use of Abridge AI technology to record the contents of the encounter as documented below.  Assessment & Plan  1. Physical exam, annual (Primary) 2. Diabetes mellitus screening 3. Screen for STD (sexually transmitted disease) 4. Screening for cardiovascular condition 5. Need for influenza vaccination Age-appropriate screening, counseling and vaccines discussed with patient. Healthy diet and exercise recommendations given.  UTD on colonoscopy, pending ROR from gynecology office to review reportedly normal mammogram and Pap results. Flu administered this visit Declines HIV and hep C testing, stating she had them through her GYN office years ago.  Declines hep B immunity testing, citing that she is confident she was immunized in childhood.  - Hemoglobin A1c; Future - Lipid panel; Future - Flu vaccine trivalent PF, 6mos and older(Flulaval,Afluria,Fluarix,Fluzone)  6. Morbid obesity (HCC) 7. Elevated blood pressure readings Current BMI of 40, per chart review was previously on phentermine  and still some weight loss on this, she would like to restart as she has regained a lot of the weight back..  It was discontinued after 3 months.  Will restart this, along with weight management referral.  If well-tolerated, then can uptitrate phentermine  dose in 4 weeks. Patient normotensive this visit, however, counseled her to check home BP readings daily, if elevated would start medication.  Counseled patient that phentermine  may worsen any pre-existing HTN.  - phentermine  15 MG capsule; Take 1 capsule (15 mg total) by mouth every morning.  Dispense: 30 capsule; Refill: 0 - Amb Ref to Medical Weight Management  - Advise on diet and exercise: 30 minutes brisk exercise five times a week, increase protein intake. - Advise on proper blood pressure measurement technique.    Follow-up: Return in about 4  weeks (around 08/10/2024) for Weight. Also schedule fasting labs 1wk from now.        Subjective:   Patient ID: [name], female    DOB: September 14, 1976  Age: 48 y.o. MRN: [id number]  Patient Care Team: Bennett Reuben POUR, MD as PCP - General (Family Medicine)   CC:  Chief Complaint  Patient presents with   Transfer of Care    Previous pt of Adina Crandall, NP. Wants to see a female provider.  Thinks she may be going through menopause. Saw GYN last week and confirmed that.      [name] is a 48 y.o. female who presents today for a complete physical exam.   Discussed the use of AI scribe software for clinical note transcription with the patient, who gave verbal consent to proceed.  History of Present Illness [name] is a 48 year old female who presents for a routine physical exam and evaluation of weight management.  She has a history of elevated blood pressure readings but has not been formally diagnosed with hypertension. Her blood pressure was 118/80 mmHg two weeks ago, but she notes that a reading of 138 is high for her. She does not regularly check her blood pressure at home but has the equipment to do so. She has not been on any antihypertensive medications.  She has been on phentermine  for weight loss in the past, which helped her lose 20 pounds, but she regained the weight after discontinuing it. She is currently trying to lose weight again and has started incorporating more protein into her diet and using weights for exercise. She walks regularly due to her job and  has recently started using a weighted hula hoop and arm weights. She is open to medication assistance for weight loss to overcome a plateau.  She reports a family history of diabetes and high blood pressure, with several relatives having died from renal failure secondary to diabetes. She is motivated to prevent diabetes.  She denies smoking and drinks alcohol casually. She works with the [employer], which influences her decision to receive a flu shot.    Advanced Directives Full code  Depression Screening;    07/13/2024   12:40 PM 10/29/2022   11:54 AM 05/10/2020   12:02 PM 09/28/2019    2:32 PM  PHQ 2/9 Scores  PHQ - 2 Score 0 0 0 1  PHQ- 9 Score  0       Anxiety Screening:    10/29/2022   11:54 AM  GAD 7 : Generalized Anxiety Score  Nervous, Anxious, on Edge 0  Control/stop worrying 0  Worry too much - different things 0  Trouble relaxing 0  Restless 0  Easily annoyed or irritable 1  Afraid - awful might happen 0  Total GAD 7 Score 1  Anxiety Difficulty Not difficult at all     ROS: Negative unless specifically indicated above in HPI.       Objective:     BP 136/86 (BP Location: Left Arm, Patient Position: Sitting, Cuff Size: Large)   Pulse 60   Temp 97.7 F (36.5 C) (Oral)   Ht 5' 2.25 (1.581 m)   Wt 221 lb (100.2 kg)   LMP 07/02/2024 (Approximate)   SpO2 99%   BMI 40.10 kg/m    Physical Exam  GENERAL: Alert, cooperative, well developed, no acute distress. Morbidly obese HEAD: Normocephalic atraumatic, oral cavity normal. EYES: Extraocular movements intact bilaterally, pupils round, equal and reactive to light bilaterally, conjunctivae normal bilaterally. EARS: Tympanic membrane, ear canal and external ear normal bilaterally. NOSE: No congestion or rhinorrhea, mucous membranes are moist. THROAT: No oropharyngeal exudate or posterior oropharyngeal erythema, throat normal. CARDIOVASCULAR: Normal heart rate and rhythm, S1 and S2 normal without murmurs. CHEST: Clear to auscultation bilaterally, no wheezes, rhonchi, or crackles. ABDOMEN: Soft, non tender, non distended, without organomegaly, normal bowel sounds, abdomen normal. EXTREMITIES: No cyanosis or edema. NEUROLOGICAL: Oriented to person, place and time, no gait abnormalities, moves all extremities without gross motor or sensory deficit. NECK: Thyroid  normal, no lymphadenopathy.       Eriyah Fernando K Jailine Lieder, MD  07/13/24    Contains text generated by [employer].

## 2024-07-13 NOTE — Patient Instructions (Addendum)
 Thank you for visiting [facility] today! Here's what we talked about: - Check BP daily for 7d and send MyChart - Dentist appt - Start Phentermine  daily - Come back for fasting labs

## 2024-07-20 ENCOUNTER — Other Ambulatory Visit (INDEPENDENT_AMBULATORY_CARE_PROVIDER_SITE_OTHER)

## 2024-07-20 DIAGNOSIS — Z136 Encounter for screening for cardiovascular disorders: Secondary | ICD-10-CM | POA: Diagnosis not present

## 2024-07-20 DIAGNOSIS — Z131 Encounter for screening for diabetes mellitus: Secondary | ICD-10-CM | POA: Diagnosis not present

## 2024-07-20 LAB — LIPID PANEL
Cholesterol: 179 mg/dL (ref 0–200)
HDL: 51.4 mg/dL (ref >39.00)
LDL Cholesterol: 115 mg/dL — ABNORMAL HIGH (ref 0–99)
NonHDL: 127.46
Total CHOL/HDL Ratio: 3
Triglycerides: 61 mg/dL (ref 0.0–149.0)
VLDL: 12.2 mg/dL (ref 0.0–40.0)

## 2024-07-20 LAB — HEMOGLOBIN A1C: Hgb A1c MFr Bld: 5.5 % (ref 4.6–6.5)

## 2024-07-28 ENCOUNTER — Ambulatory Visit: Payer: Self-pay

## 2024-08-04 ENCOUNTER — Ambulatory Visit (INDEPENDENT_AMBULATORY_CARE_PROVIDER_SITE_OTHER)

## 2024-08-04 VITALS — BP 108/74 | HR 61 | Temp 97.9°F | Ht 62.25 in | Wt 215.0 lb

## 2024-08-04 DIAGNOSIS — R030 Elevated blood-pressure reading, without diagnosis of hypertension: Secondary | ICD-10-CM | POA: Diagnosis not present

## 2024-08-04 DIAGNOSIS — E669 Obesity, unspecified: Secondary | ICD-10-CM

## 2024-08-04 NOTE — Patient Instructions (Signed)
 Thank you for visiting [facility] today! Here's what we talked about: - Continue Phentermine  at current dose - Buy BP cuff: [facility], check daily for 7 days then send to me.

## 2024-08-04 NOTE — Progress Notes (Signed)
 Subjective:   This visit was conducted in person. The patient gave informed consent to the use of Abridge AI technology to record the contents of the encounter as documented below.   Patient ID: [name], female    DOB: 01/02/1976, 48 y.o.   MRN: [id number]   Discussed the use of AI scribe software for clinical note transcription with the patient, who gave verbal consent to proceed.  History of Present Illness [name] is a 48 year old female who presents for follow-up on weight management and blood pressure monitoring.  She reports a weight of 211 pounds at home, down from 221 pounds previously, although the clinic scale shows 215 pounds. She notes a noticeable difference in clothing fit, indicating weight loss. She is currently taking phentermine  for weight management.  Regarding blood pressure, she did not purchase a blood pressure cuff but checks her blood pressure at work. She reports readings of 134/81 mmHg and once as high as 161/100 mmHg, which decreased to 101 mmHg after resting. She attributes some of the elevated readings to feeling unwell after a flu shot, experiencing congestion and general malaise. She typically checks her blood pressure at work for the first three to four days of the week, with readings in the 130s to 140s, which is higher than her usual 115-118 mmHg.  She mentions shoulder soreness after using five-pound weights three weeks ago, which has persisted. She has been trying to incorporate exercise, including cardio, and is conscious of her protein intake. She experiences occasional dizziness and a sense of being off balance, which she attributes to the phentermine .  She works with the [employer]. No palpitations, but experiences occasional dizziness and a sense of being off balance.  Review of Systems  All other systems reviewed and are negative.       Allergies  Allergen Reactions   Oxycodone Nausea And Vomiting    Current  Outpatient Medications on File Prior to Visit  Medication Sig Dispense Refill   phentermine  15 MG capsule Take 1 capsule (15 mg total) by mouth every morning. 30 capsule 0   No current facility-administered medications on file prior to visit.    BP 108/74 (BP Location: Left Arm, Patient Position: Sitting, Cuff Size: Large)   Pulse 61   Temp 97.9 F (36.6 C) (Oral)   Ht 5' 2.25 (1.581 m)   Wt 215 lb (97.5 kg)   LMP 08/01/2024 (Exact Date)   SpO2 96%   BMI 39.01 kg/m   Objective:      Physical Exam GENERAL: Alert, cooperative, well developed, no acute distress.  Obese HEAD: Normocephalic atraumatic. EYES: Extraocular movements intact BL, pupils round, equal and reactive to light BL, conjunctivae normal BL. EXTREMITIES: No cyanosis or edema. NEUROLOGICAL: Oriented to person, place and time, no gait abnormalities, moves all extremities without gross motor or sensory deficit.       Assessment & Plan:   Assessment & Plan Obesity Weight decreased from 221 lbs to 215 lbs over the past month. Current phentermine  dose effective, will refrain from increase due to potential blood pressure concerns.  - Continue phentermine  15 mg oral every morning. - Maintain follow-up with weight management clinic in December. - Encouraged cardio exercise, such as walking on a treadmill with incline. - Reassess weight and medication efficacy in three months.  Elevated blood pressure readings Some Blood pressure readings at work elevated, reportedly as high as 160s over 100, however, she says average is systolic 130s.  Normotensive today at 108/74 in the office. Current phentermine  dose may contribute to elevated blood pressure.  Will have her monitor at home, if elevated, will consider starting medication.  - Purchase a new blood pressure cuff from [facility] at [facility]. - Check blood pressure once daily for seven days, ensuring consistent time of day and relaxed  state. - Send blood pressure readings via MyChart after seven days for review. - Continue current phentermine  dose without increase.   Return in about 3 months (around 11/04/2024) for weight.   Kashtyn Jankowski K Tywanda Rice, MD  08/04/24     Contains text generated by Abridge.

## 2024-08-12 ENCOUNTER — Ambulatory Visit: Admitting: Nurse Practitioner

## 2024-08-25 ENCOUNTER — Encounter: Payer: Self-pay | Admitting: Dietician

## 2024-08-25 ENCOUNTER — Encounter: Admitting: Dietician

## 2024-08-25 NOTE — Progress Notes (Signed)
 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   Pt reports when she was pregnant with her first child she had gestational diabetes. Pt states she has family history of diabetes and high blood pressure on both sides and wants to prevent these. Pt reports she notices if her weight goes up her blood pressure creeps up. Pt reports she started phentermine  about 6 weeks ago and has had 12 lb weight loss. Pt reports she has reduced snacking (was snacking on chips/candy) and has been trying to do more protein/granola bars.   Pt reports she has 2 daughters, ages 69 and 42. Pt reports her husband is also in the household.   Pt reports she was doing small weights and a weighted hula hoop, but feels she injured her shoulder so she has not been exercising consistently.   Pt reports they have dinner at home & out about 50/50.   Estimated daily fluid intake: 20-80 oz Supplements: none Sleep: 6 hours Stress / self-care: 8 out of 10.  Current average weekly physical activity: occasional weighted hula hoop  24-Hr Dietary Recall First Meal: protein shake Snack: protein bar Second Meal: leftovers (quinoa salad) OR oatmeal and protein shake Snack: protein bar Third Meal: 6:30pm: tacos OR protein, starch, veggie OR wrap and  fries Snack: none Beverages: protein shake, water    NUTRITION DIAGNOSIS  NB-1.1 Food and nutrition-related knowledge deficit As related to lack of prior education by a registered dietitian.  As evidenced by pt report.   NUTRITION INTERVENTION  Nutrition education (E-1) on the following topics:   Exercise Finding an exercise you enjoy is crucial for maintaining long-term fitness and overall health. Enjoyable activities are more likely to become regular habits, making it easier to stay consistent with physical activity. When you look forward to your workouts, exercise becomes a positive experience rather than a chore, reducing the likelihood of burnout or quitting. Enjoyable exercise also enhances mental well-being, as engaging in activities you love can boost mood, reduce stress, and provide a sense of accomplishment. Aim for 150 minutes of physical activity weekly. Make physical activity a part of your week. Try to include at least 30 minutes of physical activity 5 days each week or at least 150 minutes per week.   Plate Method Fruits & Vegetables: Aim to fill half your plate with a variety of fruits and vegetables. They are rich in vitamins, minerals, and fiber, and can help reduce the risk of chronic diseases. Choose a colorful assortment of fruits and vegetables to ensure you get a wide range of nutrients. Grains and Starches: Make at least half of your grain choices whole grains, such as brown rice, whole wheat bread, and oats. Whole grains provide fiber, which aids in digestion and healthy cholesterol levels. Aim for whole forms of starchy vegetables such as potatoes, sweet potatoes, beans, peas, and corn, which are fiber  rich and provide many vitamins and minerals.  Protein: Incorporate lean sources of protein, such as poultry, fish, beans, nuts, and seeds, into your meals. Protein is essential for building and repairing tissues, staying full, balancing blood sugar, as well as supporting  immune function. Dairy: Include low-fat or fat-free dairy products like milk, yogurt, and cheese in your diet. Dairy foods are excellent sources of calcium and vitamin D, which are crucial for bone health.   Handouts Provided Include  Plate Method  Learning Style & Readiness for Change Teaching method utilized: Visual & Auditory  Demonstrated degree of understanding via: Teach Back  Barriers to learning/adherence to lifestyle change: none  Goals Established by Pt  Goal 1: aim for 2 of your bottles of water  per day.  Goal 2: exercise 5 days a week for 30 minutes.   Goal 3: try a new meal each week.    MONITORING & EVALUATION Dietary intake, weekly physical activity, and follow up in 6 weeks.  Next Steps  Patient is to call for questions.

## 2024-08-25 NOTE — Patient Instructions (Signed)
 Goals Established by Pt  Goal 1: aim for 2 of your bottles of water  per day.  Goal 2: exercise 5 days a week for 30 minutes.   Goal 3: try a new meal each week.

## 2024-10-08 ENCOUNTER — Encounter: Payer: Self-pay | Admitting: Dietician

## 2024-10-08 ENCOUNTER — Encounter: Admitting: Dietician

## 2024-10-08 NOTE — Progress Notes (Signed)
 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   Pt states she has been doing better about drinking 2- 40 oz bottles of water .   Pt states she wakes up at 5am and works out for 30 minutes 4 times a week. She uses a weighted hula hoop and will add other things if she wants.   Pt states she has not been eating sweets, bread, soda, junk because of her fasting with the church. She is planning to keep it going past the 21 days.  Pt states she has tried tofu and really enjoys it. She has tried making pho and really enjoyed it. Pt reports she also tried making sheet meals and those have been successful.   Pt reports she has felt more energized and had better bowel movements since making these dietary changes.   Pt reports she is adding a fiber supplement to her intake to see if it will help with lowering her cholesterol.   Pt states her main priority is staying consistent.    Estimated daily fluid intake: 20-80 oz Supplements: none Sleep: 6 hours Stress / self-care: 8 out of 10.  Current average weekly physical activity: weighted hula hoop 4-5 days per week for 30 minutes  24-Hr Dietary Recall First Meal: serving of oatmeal with strawberries and blueberries. Vanilla Protein shake with milk OR greek yogurt with fruit and granola and chia seeds. Snack: fresh vegetables and dip Second Meal: romaine lettuce, chicken, cheese and caesar dressing. If  she does not have yogurt with her breakfast then she will have it here.  Snack:  Third Meal: tofu with vegetables and asian flavors.  Snack: Tumeric,ginger,sourop shot Beverages: protein shake, water , health shots    NUTRITION DIAGNOSIS  NB-1.1 Food and nutrition-related knowledge deficit As related to lack of prior education by a registered dietitian.  As evidenced by pt report.   NUTRITION INTERVENTION  Nutrition education (E-1) on the following topics:   Exercise Finding an exercise you enjoy is crucial for maintaining long-term fitness and overall health. Enjoyable activities are more likely to become regular habits, making it easier to stay consistent with physical activity. When you look forward to your workouts, exercise becomes a positive experience rather than a chore, reducing the likelihood of burnout or quitting. Enjoyable exercise also enhances mental well-being, as engaging in activities you love can boost mood, reduce stress, and provide a sense of accomplishment. Aim for 150 minutes of physical activity weekly. Make physical activity a part of your week. Try to include at least 30 minutes of physical activity 5 days each week or at least 150 minutes per week.   Plate Method Fruits & Vegetables: Aim to fill half your plate with a variety of fruits and vegetables. They are rich in vitamins, minerals, and fiber, and can help reduce the risk of chronic diseases. Choose a colorful assortment of fruits and vegetables to ensure you get a wide range of nutrients. Grains and Starches: Make at least half of your grain choices whole grains, such as  brown rice, whole wheat bread, and oats. Whole grains provide fiber, which aids in digestion and healthy cholesterol levels. Aim for whole forms of starchy vegetables such as potatoes, sweet potatoes, beans, peas, and corn, which are fiber rich and provide many vitamins and minerals.  Protein: Incorporate lean sources of protein, such as poultry,  fish, beans, nuts, and seeds, into your meals. Protein is essential for building and repairing tissues, staying full, balancing blood sugar, as well as supporting immune function. Dairy: Include low-fat or fat-free dairy products like milk, yogurt, and cheese in your diet. Dairy foods are excellent sources of calcium and vitamin D, which are crucial for bone health.   Handouts Provided Include  Plate Method  Learning Style & Readiness for Change Teaching method utilized: Visual & Auditory  Demonstrated degree of understanding via: Teach Back  Barriers to learning/adherence to lifestyle change: none  Goals Established by Pt  Goal 1: Continue to aim for 2 of your bottles of water  per day.  Goal 2: Continue to exercise 5 days a week for 30 minutes.   Goal 3: Continue to try a new meal each week.    MONITORING & EVALUATION Dietary intake, weekly physical activity.  Next Steps  Patient is to call for questions and follow up PRN.

## 2024-11-04 ENCOUNTER — Ambulatory Visit
